# Patient Record
Sex: Female | Born: 1975 | Race: White | Hispanic: No | Marital: Single | Smoking: Never smoker
Health system: Southern US, Community
[De-identification: ages and names within clinical notes are randomized; demographics above are authoritative.]

## PROBLEM LIST (undated history)

## (undated) ENCOUNTER — Inpatient Hospital Stay (HOSPITAL_COMMUNITY): Payer: Self-pay

## (undated) DIAGNOSIS — Z789 Other specified health status: Secondary | ICD-10-CM

---

## 2001-03-21 ENCOUNTER — Other Ambulatory Visit: Admission: RE | Admit: 2001-03-21 | Discharge: 2001-03-21 | Payer: Self-pay | Admitting: Obstetrics and Gynecology

## 2001-08-16 ENCOUNTER — Encounter (HOSPITAL_COMMUNITY): Admission: RE | Admit: 2001-08-16 | Discharge: 2001-09-15 | Payer: Self-pay | Admitting: *Deleted

## 2001-08-16 ENCOUNTER — Encounter: Payer: Self-pay | Admitting: *Deleted

## 2001-09-20 ENCOUNTER — Encounter (HOSPITAL_COMMUNITY): Admission: RE | Admit: 2001-09-20 | Discharge: 2001-10-20 | Payer: Self-pay | Admitting: *Deleted

## 2003-06-10 ENCOUNTER — Inpatient Hospital Stay (HOSPITAL_COMMUNITY): Admission: AD | Admit: 2003-06-10 | Discharge: 2003-06-10 | Payer: Self-pay | Admitting: *Deleted

## 2003-06-11 ENCOUNTER — Encounter: Payer: Self-pay | Admitting: *Deleted

## 2003-07-03 ENCOUNTER — Inpatient Hospital Stay (HOSPITAL_COMMUNITY): Admission: AD | Admit: 2003-07-03 | Discharge: 2003-07-03 | Payer: Self-pay | Admitting: *Deleted

## 2007-04-08 ENCOUNTER — Ambulatory Visit (HOSPITAL_COMMUNITY): Admission: RE | Admit: 2007-04-08 | Discharge: 2007-04-08 | Payer: Self-pay | Admitting: Obstetrics & Gynecology

## 2008-12-31 ENCOUNTER — Inpatient Hospital Stay (HOSPITAL_COMMUNITY): Admission: AD | Admit: 2008-12-31 | Discharge: 2008-12-31 | Payer: Self-pay | Admitting: Obstetrics & Gynecology

## 2011-04-06 LAB — URINALYSIS, ROUTINE W REFLEX MICROSCOPIC
Bilirubin Urine: NEGATIVE
Glucose, UA: NEGATIVE mg/dL
Hgb urine dipstick: NEGATIVE
Ketones, ur: NEGATIVE mg/dL
Nitrite: NEGATIVE
Protein, ur: NEGATIVE mg/dL
Specific Gravity, Urine: 1.015 (ref 1.005–1.030)
Urobilinogen, UA: 0.2 mg/dL (ref 0.0–1.0)
pH: 6 (ref 5.0–8.0)

## 2011-04-06 LAB — RAPID URINE DRUG SCREEN, HOSP PERFORMED
Amphetamines: NOT DETECTED
Barbiturates: NOT DETECTED
Benzodiazepines: NOT DETECTED
Cocaine: POSITIVE — AB
Opiates: NOT DETECTED
Tetrahydrocannabinol: NOT DETECTED

## 2011-06-27 ENCOUNTER — Emergency Department (HOSPITAL_COMMUNITY)
Admission: EM | Admit: 2011-06-27 | Discharge: 2011-06-27 | Disposition: A | Discharge status: Home / Self Care | Payer: Self-pay | Attending: Emergency Medicine | Admitting: Emergency Medicine

## 2011-06-27 DIAGNOSIS — R300 Dysuria: Secondary | ICD-10-CM | POA: Insufficient documentation

## 2011-06-27 DIAGNOSIS — N390 Urinary tract infection, site not specified: Secondary | ICD-10-CM | POA: Insufficient documentation

## 2011-06-27 DIAGNOSIS — R109 Unspecified abdominal pain: Secondary | ICD-10-CM | POA: Insufficient documentation

## 2011-06-27 LAB — URINALYSIS, ROUTINE W REFLEX MICROSCOPIC
Bilirubin Urine: NEGATIVE
Glucose, UA: NEGATIVE mg/dL
Ketones, ur: 15 mg/dL — AB
Nitrite: NEGATIVE
Specific Gravity, Urine: 1.022 (ref 1.005–1.030)
pH: 6 (ref 5.0–8.0)

## 2011-06-27 LAB — URINE MICROSCOPIC-ADD ON

## 2011-06-28 LAB — URINE CULTURE

## 2012-03-10 ENCOUNTER — Inpatient Hospital Stay (HOSPITAL_COMMUNITY)

## 2012-03-10 ENCOUNTER — Inpatient Hospital Stay (HOSPITAL_COMMUNITY)
Admission: AD | Admit: 2012-03-10 | Discharge: 2012-03-10 | Source: Ambulatory Visit | Attending: Obstetrics and Gynecology | Admitting: Obstetrics and Gynecology

## 2012-03-10 ENCOUNTER — Encounter (HOSPITAL_COMMUNITY): Payer: Self-pay | Admitting: *Deleted

## 2012-03-10 DIAGNOSIS — N390 Urinary tract infection, site not specified: Secondary | ICD-10-CM

## 2012-03-10 DIAGNOSIS — O2390 Unspecified genitourinary tract infection in pregnancy, unspecified trimester: Secondary | ICD-10-CM | POA: Insufficient documentation

## 2012-03-10 DIAGNOSIS — O4690 Antepartum hemorrhage, unspecified, unspecified trimester: Secondary | ICD-10-CM

## 2012-03-10 DIAGNOSIS — O209 Hemorrhage in early pregnancy, unspecified: Secondary | ICD-10-CM | POA: Insufficient documentation

## 2012-03-10 HISTORY — DX: Other specified health status: Z78.9

## 2012-03-10 LAB — URINALYSIS, ROUTINE W REFLEX MICROSCOPIC
Bilirubin Urine: NEGATIVE
Glucose, UA: NEGATIVE mg/dL
Ketones, ur: NEGATIVE mg/dL
Protein, ur: NEGATIVE mg/dL
pH: 6 (ref 5.0–8.0)

## 2012-03-10 LAB — URINE MICROSCOPIC-ADD ON

## 2012-03-10 LAB — CBC
HCT: 36.8 % (ref 36.0–46.0)
Hemoglobin: 11.3 g/dL — ABNORMAL LOW (ref 12.0–15.0)
MCH: 25.5 pg — ABNORMAL LOW (ref 26.0–34.0)
MCHC: 30.7 g/dL (ref 30.0–36.0)
RDW: 16 % — ABNORMAL HIGH (ref 11.5–15.5)

## 2012-03-10 LAB — WET PREP, GENITAL
Clue Cells Wet Prep HPF POC: NONE SEEN
Trich, Wet Prep: NONE SEEN

## 2012-03-10 MED ORDER — CEPHALEXIN 500 MG PO CAPS: 500.0000 mg | ORAL_CAPSULE | Freq: Four times a day (QID) | ORAL | Status: AC

## 2012-03-10 NOTE — MAU Note (Signed)
Started bleeding a wk ago, started as red, now brownish. Only seen when wipes.  No real pain.  Hx of irregular cycles, LMP was Dec or Jan, first + HPT was March 9.

## 2012-03-10 NOTE — MAU Provider Note (Signed)
History   Pt presents today c/o vag bleeding in preg. She states she has been spotting every day for the past 1 wk. She denies abd pain, dysuria, fever,or any other sx. She is currently incarcerated and states her last episode of intercourse was about 2wks ago.  CSN: [id number]  Arrival date and time: 03/10/12 4098   First Provider Initiated Contact with Patient 03/10/12 [phone]      Chief Complaint  Patient presents with  . Vaginal Bleeding   HPI  OB History    Grav Para Term Preterm Abortions TAB SAB Ect Mult Living   8 5 5  0 1 0 1 0 0 5      Past Medical History  Diagnosis Date  . No pertinent past medical history     Past Surgical History  Procedure Date  . Cesarean section     Family History  Problem Relation Age of Onset  . Anesthesia problems Neg Hx     History  Substance Use Topics  . Smoking status: Never Smoker   . Smokeless tobacco: Not on file  . Alcohol Use: Yes    Allergies: No Known Allergies  Prescriptions prior to admission  Medication Sig Dispense Refill  . Prenatal Vit-Fe Fumarate-FA (PRENATAL MULTIVITAMIN) TABS Take 1 tablet by mouth at bedtime.        Review of Systems  Constitutional: Negative for fever and chills.  Eyes: Negative for blurred vision and double vision.  Respiratory: Negative for cough, hemoptysis, sputum production, shortness of breath and wheezing.   Cardiovascular: Negative for chest pain and palpitations.  Gastrointestinal: Negative for nausea, vomiting, abdominal pain, diarrhea and constipation.  Genitourinary: Negative for dysuria, urgency, frequency and hematuria.  Neurological: Negative for dizziness and headaches.  Psychiatric/Behavioral: Negative for depression and suicidal ideas.   Physical Exam   Blood pressure 101/63, pulse 72, temperature 97.3 F (36.3 C), temperature source Oral, resp. rate 20, height 5\' 1"  (1.549 m), weight 136 lb (61.689 kg), last menstrual period 12/20/2011.  Physical Exam  Nursing  note and vitals reviewed. Constitutional: She is oriented to person, place, and time. She appears well-developed and well-nourished. No distress.  HENT:  Head: Normocephalic and atraumatic.  Eyes: EOM are normal. Pupils are equal, round, and reactive to light.  GI: Soft. She exhibits no distension and no mass. There is no tenderness. There is no rebound and no guarding.  Genitourinary: There is bleeding around the vagina. Vaginal discharge found.       Moderate amount of vag bleeding noted on exam. Cervix Lg/closed. Uterus 8wks size and non-tender. No adnexal masses. Pt non-tender on exam.  Neurological: She is alert and oriented to person, place, and time.  Skin: Skin is warm and dry. She is not diaphoretic.  Psychiatric: She has a normal mood and affect. Her behavior is normal. Judgment and thought content normal.    MAU Course  Procedures  Wet prep and GC/Chlamydia done.  Results for orders placed during the hospital encounter of 03/10/12 (from the past 72 hour(s))  WET PREP, GENITAL     Status: Abnormal   Collection Time   03/10/12  9:30 AM      Component Value Range Comment   Yeast Wet Prep HPF POC NONE SEEN  NONE SEEN     Trich, Wet Prep NONE SEEN  NONE SEEN     Clue Cells Wet Prep HPF POC NONE SEEN  NONE SEEN     WBC, Wet Prep HPF POC FEW (*) NONE SEEN  MANY BACTERIA SEEN  CBC     Status: Abnormal   Collection Time   03/10/12  9:32 AM      Component Value Range Comment   WBC 8.2  4.0 - 10.5 (K/uL)    RBC 4.43  3.87 - 5.11 (MIL/uL)    Hemoglobin 11.3 (*) 12.0 - 15.0 (g/dL)    HCT 16.1  09.6 - 04.5 (%)    MCV 83.1  78.0 - 100.0 (fL)    MCH 25.5 (*) 26.0 - 34.0 (pg)    MCHC 30.7  30.0 - 36.0 (g/dL)    RDW 40.9 (*) 81.1 - 15.5 (%)    Platelets 279  150 - 400 (K/uL)   HCG, QUANTITATIVE, PREGNANCY     Status: Abnormal   Collection Time   03/10/12  9:32 AM      Component Value Range Comment   hCG, Beta Chain, Quant, S 3705 (*) <5 (mIU/mL)   ABO/RH     Status: Normal  (Preliminary result)   Collection Time   03/10/12  9:32 AM      Component Value Range Comment   ABO/RH(D) O POS     URINALYSIS, ROUTINE W REFLEX MICROSCOPIC     Status: Abnormal   Collection Time   03/10/12  9:40 AM      Component Value Range Comment   Color, Urine YELLOW  YELLOW     APPearance HAZY (*) CLEAR     Specific Gravity, Urine 1.010  1.005 - 1.030     pH 6.0  5.0 - 8.0     Glucose, UA NEGATIVE  NEGATIVE (mg/dL)    Hgb urine dipstick LARGE (*) NEGATIVE     Bilirubin Urine NEGATIVE  NEGATIVE     Ketones, ur NEGATIVE  NEGATIVE (mg/dL)    Protein, ur NEGATIVE  NEGATIVE (mg/dL)    Urobilinogen, UA 0.2  0.0 - 1.0 (mg/dL)    Nitrite NEGATIVE  NEGATIVE     Leukocytes, UA TRACE (*) NEGATIVE    URINE MICROSCOPIC-ADD ON     Status: Abnormal   Collection Time   03/10/12  9:40 AM      Component Value Range Comment   Squamous Epithelial / LPF FEW (*) RARE     WBC, UA 3-6  <3 (WBC/hpf)    RBC / HPF 7-10  <3 (RBC/hpf)    Bacteria, UA MANY (*) RARE    POCT PREGNANCY, URINE     Status: Abnormal   Collection Time   03/10/12  9:48 AM      Component Value Range Comment   Preg Test, Ur POSITIVE (*) NEGATIVE      Urine sent for culture.  US shows single intrauterine gestational sac with yolk sac. Small subchorionic hemorrhage is noted. Assessment and Plan  Vag bleeding in preg: discussed with pt at length. She is to begin prenatal care. Gave precautions. Discussed diet, activity, risks, and precautions.  UTI: will await urine culture. Will give Rx for Keflex. Discussed diet, activity,risks, and precautions.  Clinton Gallant. Aubreanna Percle III, DrHSc, MPAS, PA-C  03/10/2012, 10:03 AM

## 2012-03-10 NOTE — MAU Note (Signed)
Pt in c/o spotting with wiping x1 week.  States its typically brown, but occasionally red.  Denies any pain.  LMP 12/20/11.

## 2012-03-10 NOTE — Discharge Instructions (Signed)
Urinary Tract Infection Infections of the urinary tract can start in several places. A bladder infection (cystitis), a kidney infection (pyelonephritis), and a prostate infection (prostatitis) are different types of urinary tract infections (UTIs). They usually get better if treated with medicines (antibiotics) that kill germs. Take all the medicine until it is gone. You or your child may feel better in a few days, but TAKE ALL MEDICINE or the infection may not respond and may become more difficult to treat. HOME CARE INSTRUCTIONS   Drink enough water and fluids to keep the urine clear or pale yellow. Cranberry juice is especially recommended, in addition to large amounts of water.   Avoid caffeine, tea, and carbonated beverages. They tend to irritate the bladder.   Alcohol may irritate the prostate.   Only take over-the-counter or prescription medicines for pain, discomfort, or fever as directed by your caregiver.  To prevent further infections:  Empty the bladder often. Avoid holding urine for long periods of time.   After a bowel movement, women should cleanse from front to back. Use each tissue only once.   Empty the bladder before and after sexual intercourse.  FINDING OUT THE RESULTS OF YOUR TEST Not all test results are available during your visit. If your or your child's test results are not back during the visit, make an appointment with your caregiver to find out the results. Do not assume everything is normal if you have not heard from your caregiver or the medical facility. It is important for you to follow up on all test results. SEEK MEDICAL CARE IF:   There is back pain.   Your baby is older than 3 months with a rectal temperature of 100.5 F (38.1 C) or higher for more than 1 day.   Your or your child's problems (symptoms) are no better in 3 days. Return sooner if you or your child is getting worse.  SEEK IMMEDIATE MEDICAL CARE IF:   There is severe back pain or lower  abdominal pain.   You or your child develops chills.   You have a fever.   Your baby is older than 3 months with a rectal temperature of 102 F (38.9 C) or higher.   Your baby is 20 months old or younger with a rectal temperature of 100.4 F (38 C) or higher.   There is nausea or vomiting.   There is continued burning or discomfort with urination.  MAKE SURE YOU:   Understand these instructions.   Will watch your condition.   Will get help right away if you are not doing well or get worse.  Document Released: 09/16/2005 Document Revised: 11/26/2011 Document Reviewed: 04/21/2007 [facility] Patient Information 2012 [place], [place].Vaginal Bleeding During Pregnancy A small amount of bleeding from the vagina can happen anytime during pregnancy. Be sure to tell your doctor about all vaginal bleeding.  HOME CARE  Get plenty of rest and sleep.   Count the number of pads you use each day. Do not use tampons.   Save any tissue you pass for your doctor to see.   Do not exercise   Do not do any heavy lifting.   Avoid going up and down stairs. If you must climb stairs, go slowly.   Do not have sex (intercourse) or orgasms until approved by your doctor.   Do not douche.   Only take medicine as told by your doctor. Do not take aspirin.   Eat healthy.   Always keep your follow-up appointments.  GET HELP RIGHT AWAY IF:   You feel the baby moving less or not moving at all.   The bleeding gets worse.   You have very painful cramps or pain in your stomach or back.   You pass large clots or anything that looks like tissue.   You have a temperature by mouth above 102 F (38.9 C).   You feel very weak.   You have chills.   You feel dizzy or pass out (faint).   You have a gush of fluid from the vagina.  MAKE SURE YOU:   Understand these instructions.   Will watch your condition.   Will get help right away if you are not doing well or get worse.  Document Released:  09/15/2008 Document Revised: 11/26/2011 Document Reviewed: 11/12/2009 [facility] Patient Information 2012 [place], [place].

## 2012-03-11 LAB — URINE CULTURE: Culture  Setup Time: 201303211441

## 2012-03-11 LAB — GC/CHLAMYDIA PROBE AMP, GENITAL: Chlamydia, DNA Probe: POSITIVE — AB

## 2012-03-12 NOTE — MAU Provider Note (Signed)
Agree with above note.  Becky Jennings 03/12/2012 6:55 AM   

## 2012-04-18 ENCOUNTER — Inpatient Hospital Stay (HOSPITAL_COMMUNITY)

## 2012-04-18 ENCOUNTER — Inpatient Hospital Stay (HOSPITAL_COMMUNITY)
Admission: AD | Admit: 2012-04-18 | Discharge: 2012-04-18 | Disposition: A | Discharge status: Home / Self Care | Source: Ambulatory Visit | Attending: Obstetrics & Gynecology | Admitting: Obstetrics & Gynecology

## 2012-04-18 ENCOUNTER — Encounter (HOSPITAL_COMMUNITY): Payer: Self-pay

## 2012-04-18 DIAGNOSIS — O039 Complete or unspecified spontaneous abortion without complication: Secondary | ICD-10-CM

## 2012-04-18 DIAGNOSIS — O368390 Maternal care for abnormalities of the fetal heart rate or rhythm, unspecified trimester, not applicable or unspecified: Secondary | ICD-10-CM

## 2012-04-18 NOTE — MAU Note (Signed)
[facility]. They were unable to detect a fetal heart tone. Patient states she had some bleeding for 4 days last week, No pain or bleeding at this time.

## 2012-04-18 NOTE — Discharge Instructions (Signed)
Miscarriage  An early pregnancy loss or spontaneous abortion (miscarriage) is a common problem. This usually happens when the pregnancy is not developing normally. It is very unlikely that you or your partner did anything to cause this, although cigarette smoking, a sexually transmitted disease, excessive alcohol use, or drug abuse can increase the risk. Other causes are:   Abnormalities of the uterus.   Hormone or medical problems.   Trauma or genetic (chromosome) problems.  Having a miscarriage does not change your chances of having a normal pregnancy in the future. Your caregiver will advise you when it is safe to try to get pregnant again.  AFTER A MISCARRIAGE   A miscarriage is inevitable when there is continual, heavy vaginal bleeding; cramping; dilation of the cervix; or passing of any pregnancy tissue. Bleeding and cramping will usually continue until all the tissue has been removed from the womb (uterus).   Often the uterus does not clean itself out completely. A medication or a D&C procedure is needed to loosen or remove the pregnancy tissue from the uterus. A D&C scrapes or suctions the tissue out.   If you are RH negative, you may need to have Rh immune globulin to avoid Rh problems.   You may be given medication to fight an infection if the miscarriage was due to an infection.  HOME CARE INSTRUCTIONS    You should rest in bed for the next 2 to 3 days.   Do not take tub baths or put anything in your vagina, including tampons or a douche.   Do not have sex until your caregiver approves.   Avoid exercise or heavy activities until directed by your caregiver.   Save any vaginal discharge that looks like tissue. Ask your caregiver if he or she wants to inspect the discharge.   If you and your partner are having problems with guilt or grieving, talk to your caregiver or get counseling to help you understand and cope with your pregnancy loss.   Allow enough time to grieve before trying to get  pregnant again.  SEEK IMMEDIATE MEDICAL CARE IF:    You have persistent heavy bleeding or a bad smelling vaginal discharge.   You have continued abdominal or pelvic pain.   You have an oral temperature above 102 F (38.9 C), not controlled by medicine.   You have severe weakness, fainting, or keep throwing up (vomiting).   You develop chills.   You are experiencing domestic violence.  MAKE SURE YOU:    Understand these instructions.   Will watch your condition.   Will get help right away if you are not doing well or get worse.  Document Released: 01/14/2005 Document Revised: 11/26/2011 Document Reviewed: 11/29/2008  [employer] Patient Information 2012 [employer].

## 2012-04-18 NOTE — MAU Note (Signed)
Pt sent over from health department due to inability to hear fetal heart tones.  Had 4 days of moderate bleeding last week, no pain.  No bleeding now.

## 2012-04-18 NOTE — MAU Provider Note (Signed)
History     CSN: [id number]  Arrival date & time 04/18/12  1020   None     No chief complaint on file.   HPI Becky Jennings is a 36 y.o. female @ [redacted]w[redacted]d gestation who presents to MAU for vaginal bleeding and possible miscarriage. She was at the health department today for her prenatal visit and they were unable to heart fetal heart tones. She was sent to MAU for further evaluation. She reports having vaginal bleeding that was equal to a period for 3 days and then the bleeding stopped. She had no pain with the bleeding. Her last ultrasound was 03/10/12 and showed a 6.1 week IUGS with YS and a small SCH. The patient was escorted to MAU by the [facility] Department. The history was provided by the patient and her previous medical record.   Past Medical History  Diagnosis Date  . No pertinent past medical history     Past Surgical History  Procedure Date  . Cesarean section     Family History  Problem Relation Age of Onset  . Anesthesia problems Neg Hx     History  Substance Use Topics  . Smoking status: Never Smoker   . Smokeless tobacco: Not on file  . Alcohol Use: Yes    OB History    Grav Para Term Preterm Abortions TAB SAB Ect Mult Living   8 5 5  0 1 0 1 0 0 5      Review of Systems  Constitutional: Negative for fever, appetite change and fatigue.  HENT: Negative.   Eyes: Negative.   Respiratory: Negative.   Cardiovascular: Negative.   Gastrointestinal: Negative for nausea, vomiting and abdominal pain.  Genitourinary: Negative for dysuria, urgency, flank pain, vaginal bleeding (bleeding has stopped), vaginal discharge, vaginal pain and pelvic pain.  Musculoskeletal: Negative for back pain.  Neurological: Negative for dizziness and headaches.    Allergies  Review of patient's allergies indicates no known allergies.  Home Medications  No current outpatient prescriptions on file.  BP 108/78  Pulse 82  Temp(Src) 98.1 F (36.7 C) (Oral)  Resp  16  Ht 5' (1.524 m)  Wt 135 lb (61.236 kg)  BMI 26.37 kg/m2  SpO2 100%  LMP 12/20/2011   Physical Exam  Nursing note and vitals reviewed. Constitutional: She is oriented to person, place, and time. She appears well-developed and well-nourished. No distress.  HENT:  Head: Normocephalic.  Eyes: EOM are normal.  Neck: Neck supple.  Cardiovascular: Normal rate.   Pulmonary/Chest: Effort normal.  Abdominal: Soft. There is no tenderness.  Musculoskeletal: Normal range of motion.  Neurological: She is alert and oriented to person, place, and time. No cranial nerve deficit.  Skin: Skin is warm and dry.  Psychiatric: She has a normal mood and affect. Her behavior is normal. Judgment and thought content normal.   Her blood type is O positive  Assessment: Complete SAB  Plan:  Follow up with [facility] as needed.    I have reviewed this patient's vital signs, nurses notes, appropriate labs and imaging.  ED Course  Procedures   MDM

## 2012-04-27 NOTE — MAU Provider Note (Signed)
Medical Screening exam and patient care preformed by advanced practice provider.  Agree with the above management.  

## 2013-02-18 ENCOUNTER — Inpatient Hospital Stay (HOSPITAL_COMMUNITY): Payer: Medicaid Other

## 2013-02-18 ENCOUNTER — Encounter (HOSPITAL_COMMUNITY): Payer: Self-pay | Admitting: Obstetrics and Gynecology

## 2013-02-18 ENCOUNTER — Inpatient Hospital Stay (HOSPITAL_COMMUNITY)
Admission: AD | Admit: 2013-02-18 | Discharge: 2013-02-18 | Disposition: A | Discharge status: Home / Self Care | Payer: Medicaid Other | Source: Ambulatory Visit | Attending: Obstetrics & Gynecology | Admitting: Obstetrics & Gynecology

## 2013-02-18 DIAGNOSIS — O200 Threatened abortion: Secondary | ICD-10-CM

## 2013-02-18 DIAGNOSIS — O26859 Spotting complicating pregnancy, unspecified trimester: Secondary | ICD-10-CM | POA: Insufficient documentation

## 2013-02-18 DIAGNOSIS — R109 Unspecified abdominal pain: Secondary | ICD-10-CM | POA: Insufficient documentation

## 2013-02-18 LAB — URINALYSIS, ROUTINE W REFLEX MICROSCOPIC
Glucose, UA: NEGATIVE mg/dL
Hgb urine dipstick: NEGATIVE
Specific Gravity, Urine: 1.01 (ref 1.005–1.030)
Urobilinogen, UA: 0.2 mg/dL (ref 0.0–1.0)
pH: 6 (ref 5.0–8.0)

## 2013-02-18 LAB — HCG, QUANTITATIVE, PREGNANCY: hCG, Beta Chain, Quant, S: 67600 m[IU]/mL — ABNORMAL HIGH (ref <5)

## 2013-02-18 LAB — CBC
Hemoglobin: 11.5 g/dL — ABNORMAL LOW (ref 12.0–15.0)
MCHC: 33.7 g/dL (ref 30.0–36.0)
Platelets: 246 10*3/uL (ref 150–400)
RDW: 12.3 % (ref 11.5–15.5)

## 2013-02-18 LAB — WET PREP, GENITAL: Trich, Wet Prep: NONE SEEN

## 2013-02-18 NOTE — MAU Provider Note (Signed)
History     CSN: [id number]  Arrival date and time: 02/18/13 1715   None     Chief Complaint  Patient presents with  . Abdominal Cramping   HPI 37 y.o. [id number] at [redacted]w[redacted]d by LMP with cramping x 1 week, spotting last week. H/O SAB.    Past Medical History  Diagnosis Date  . No pertinent past medical history     Past Surgical History  Procedure Laterality Date  . Cesarean section      Family History  Problem Relation Age of Onset  . Anesthesia problems Neg Hx     History  Substance Use Topics  . Smoking status: Never Smoker   . Smokeless tobacco: Not on file  . Alcohol Use: Yes    Allergies: No Known Allergies  Prescriptions prior to admission  Medication Sig Dispense Refill  . Prenatal Vit-Fe Fumarate-FA (PRENATAL MULTIVITAMIN) TABS Take 1 tablet by mouth at bedtime.        Review of Systems  Constitutional: Negative.   Respiratory: Negative.   Cardiovascular: Negative.   Gastrointestinal: Positive for abdominal pain. Negative for nausea, vomiting, diarrhea and constipation.  Genitourinary: Negative for dysuria, urgency, frequency, hematuria and flank pain.       + spotting   Musculoskeletal: Negative.   Neurological: Negative.   Psychiatric/Behavioral: Negative.    Physical Exam   Blood pressure 114/80, pulse 92, temperature 98.5 F (36.9 C), temperature source Oral, resp. rate 18, height 5' (1.524 m), weight 137 lb 6.4 oz (62.324 kg), last menstrual period 12/16/2012.  Physical Exam  Nursing note and vitals reviewed. Constitutional: She is oriented to person, place, and time. She appears well-developed and well-nourished. No distress.  Cardiovascular: Normal rate.   Respiratory: Effort normal.  GI: Soft. There is no tenderness.  Genitourinary: There is no tenderness or lesion on the right labia. There is no tenderness or lesion on the left labia. Uterus is enlarged (9 week size). Uterus is not tender. Cervix exhibits no motion tenderness, no  discharge and no friability. Right adnexum displays no mass, no tenderness and no fullness. Left adnexum displays no mass, no tenderness and no fullness. No bleeding around the vagina. No vaginal discharge found.    Musculoskeletal: Normal range of motion.  Neurological: She is alert and oriented to person, place, and time.  Skin: Skin is warm and dry.  Psychiatric: She has a normal mood and affect.    MAU Course  Procedures Results for orders placed during the hospital encounter of 02/18/13 (from the past 24 hour(s))  URINALYSIS, ROUTINE W REFLEX MICROSCOPIC     Status: None   Collection Time    02/18/13  5:15 PM      Result Value Range   Color, Urine YELLOW  YELLOW   APPearance CLEAR  CLEAR   Specific Gravity, Urine 1.010  1.005 - 1.030   pH 6.0  5.0 - 8.0   Glucose, UA NEGATIVE  NEGATIVE mg/dL   Hgb urine dipstick NEGATIVE  NEGATIVE   Bilirubin Urine NEGATIVE  NEGATIVE   Ketones, ur NEGATIVE  NEGATIVE mg/dL   Protein, ur NEGATIVE  NEGATIVE mg/dL   Urobilinogen, UA 0.2  0.0 - 1.0 mg/dL   Nitrite NEGATIVE  NEGATIVE   Leukocytes, UA NEGATIVE  NEGATIVE  POCT PREGNANCY, URINE     Status: Abnormal   Collection Time    02/18/13  5:34 PM      Result Value Range   Preg Test, Ur POSITIVE (*) NEGATIVE  CBC  Status: Abnormal   Collection Time    02/18/13  5:50 PM      Result Value Range   WBC 8.0  4.0 - 10.5 K/uL   RBC 3.80 (*) 3.87 - 5.11 MIL/uL   Hemoglobin 11.5 (*) 12.0 - 15.0 g/dL   HCT 16.1 (*) 09.6 - 04.5 %   MCV 89.7  78.0 - 100.0 fL   MCH 30.3  26.0 - 34.0 pg   MCHC 33.7  30.0 - 36.0 g/dL   RDW 40.9  81.1 - 91.4 %   Platelets 246  150 - 400 K/uL  WET PREP, GENITAL     Status: Abnormal   Collection Time    02/18/13  6:20 PM      Result Value Range   Yeast Wet Prep HPF POC NONE SEEN  NONE SEEN   Trich, Wet Prep NONE SEEN  NONE SEEN   Clue Cells Wet Prep HPF POC NONE SEEN  NONE SEEN   WBC, Wet Prep HPF POC MODERATE (*) NONE SEEN   10 wk IUP, + FHR on  u/s  Assessment and Plan  . 1. Bloody show and cramping in early pregnancy   Precautions rev'd, pregnancy verification given Encouraged to start care as soon as possible, needs pap when she starts prenatal care (informed of lesion on cervix - she states "it's been a long time" since she had a pap)    Medication List    TAKE these medications       prenatal multivitamin Tabs  Take 1 tablet by mouth at bedtime.            Follow-up Information   Follow up with provider of your choice. (start prenatal care as soon as possible)         Jaasiel Hollyfield 02/18/2013, 6:01 PM

## 2013-02-18 NOTE — MAU Note (Signed)
Pt reports had positive HPT. C/O abd cramping x 1 week. Had some spotting last week none now.

## 2013-02-18 NOTE — MAU Provider Note (Signed)
Attestation of Attending Supervision of Advanced Practitioner (PA/CNM/NP): Evaluation and management procedures were performed by the Advanced Practitioner under my supervision and collaboration.  I have reviewed the Advanced Practitioner's note and chart, and I agree with the management and plan.  Masao Junker, MD, FACOG Attending Obstetrician & Gynecologist Faculty Practice, [facility]  

## 2013-02-20 LAB — GC/CHLAMYDIA PROBE AMP: CT Probe RNA: NEGATIVE

## 2013-05-31 IMAGING — US US OB TRANSVAGINAL
1 series · 13 of 28 positions shown · non-contrast
Comparison: none

[Series 1: us ob transvaginal · 13 of 41 slices shown]
[im 2/41]
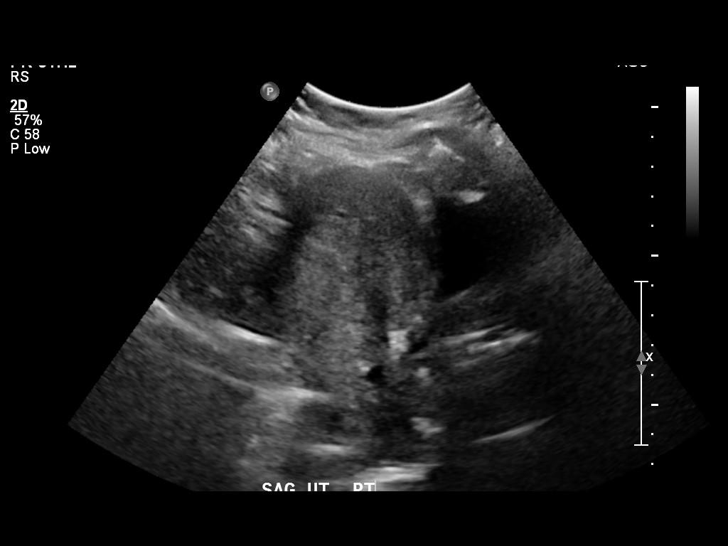
[im 5/41]
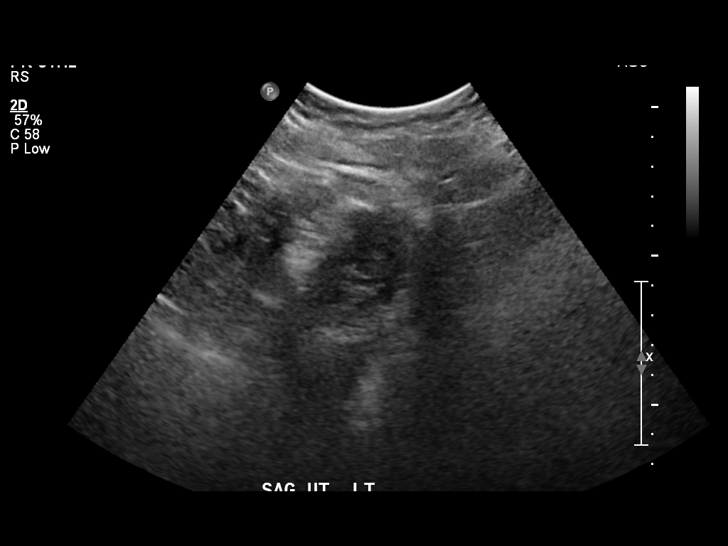
[im 8/41]
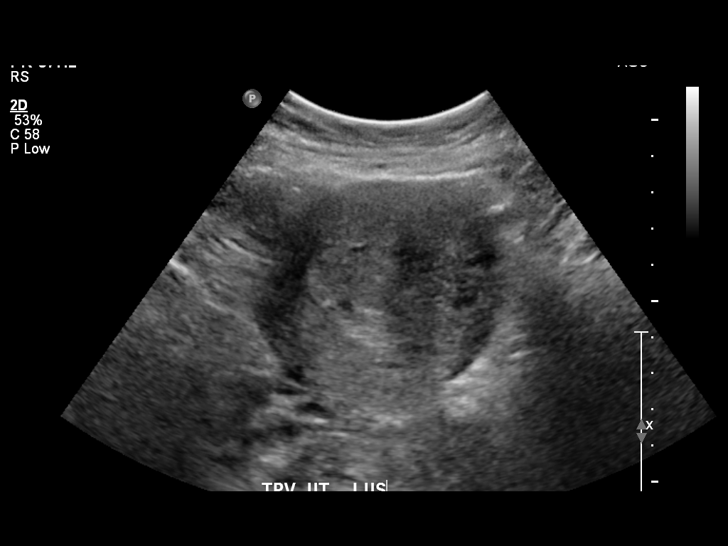
[im 11/41]
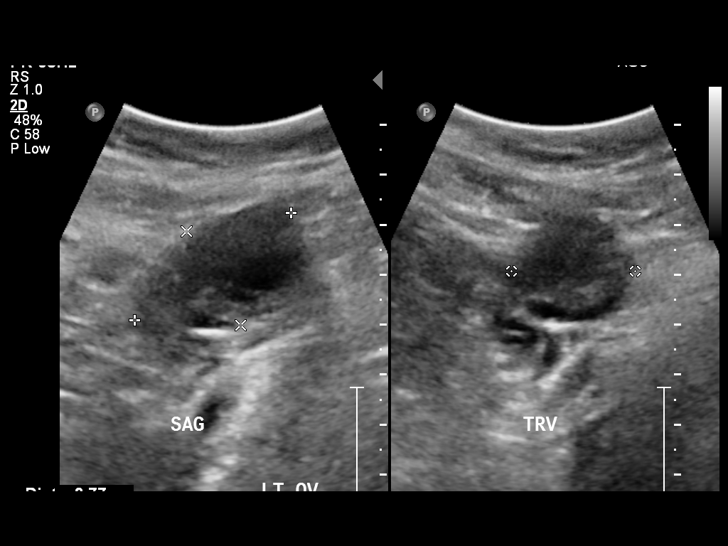
[im 14/41]
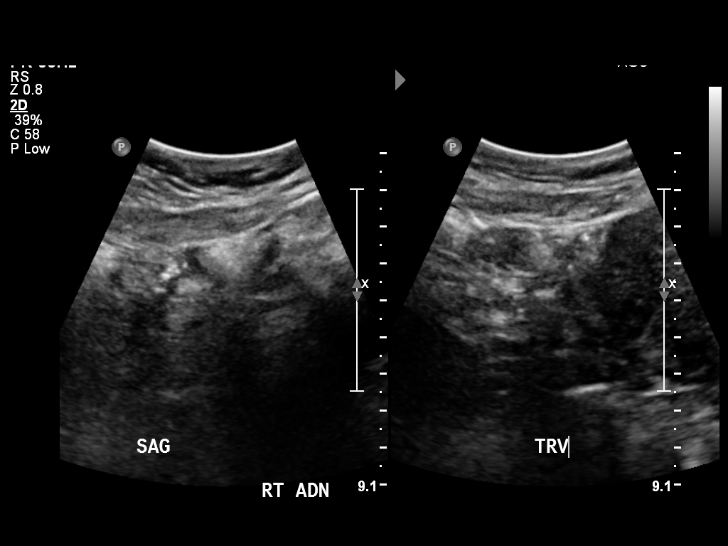
[im 17/41]
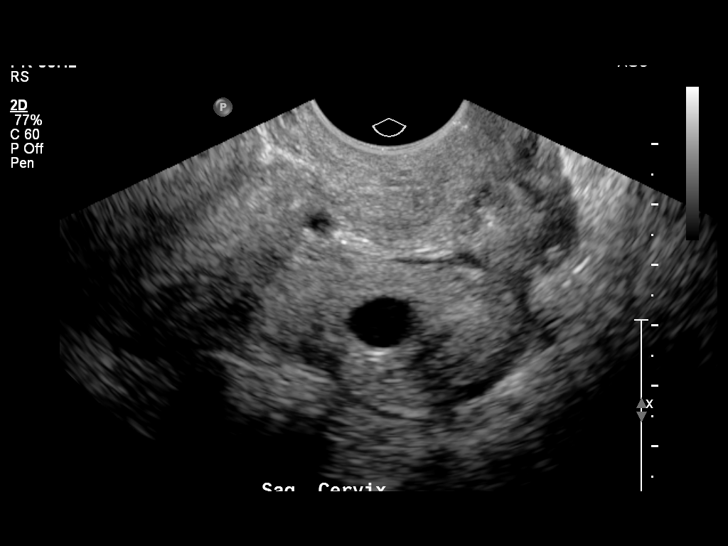
[im 21/41]
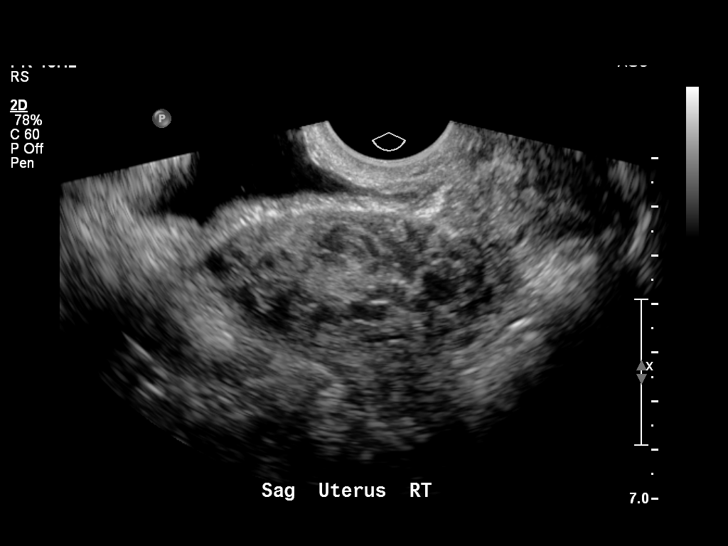
[im 24/41]
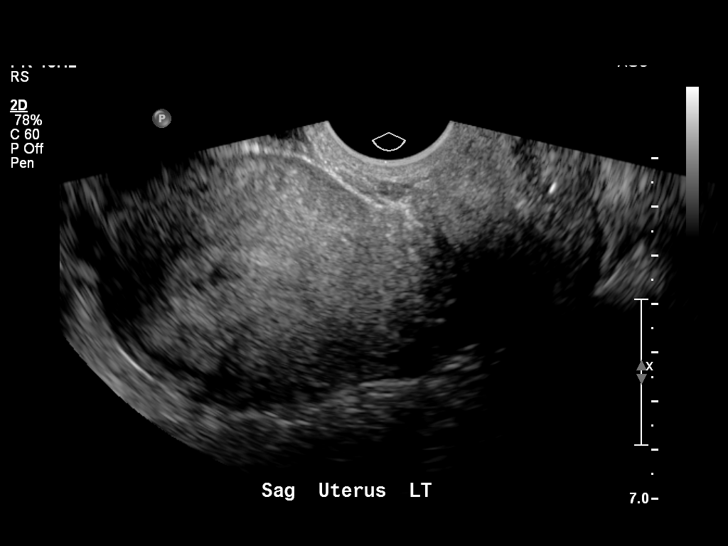
[im 27/41]
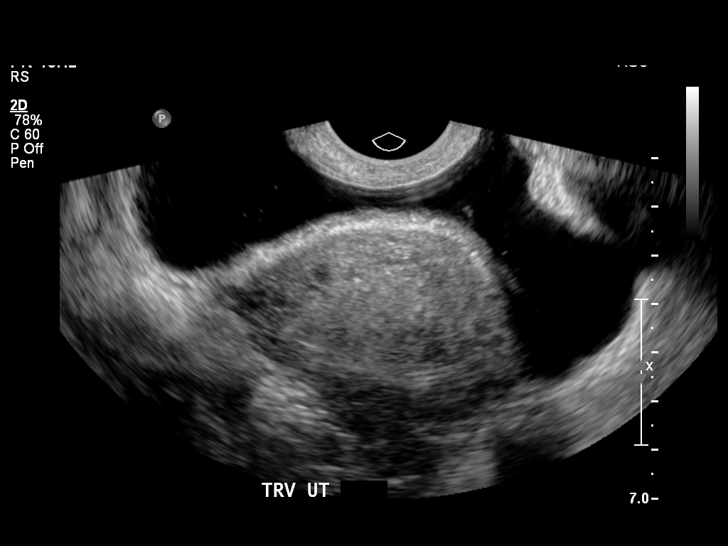
[im 30/41]
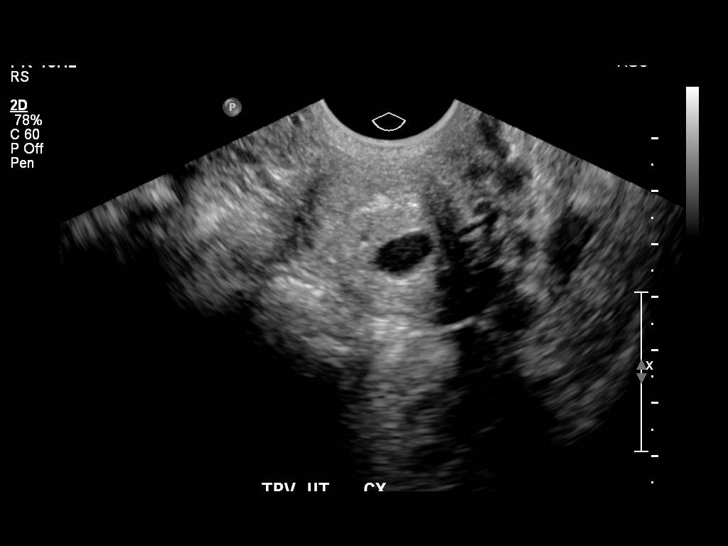
[im 33/41]
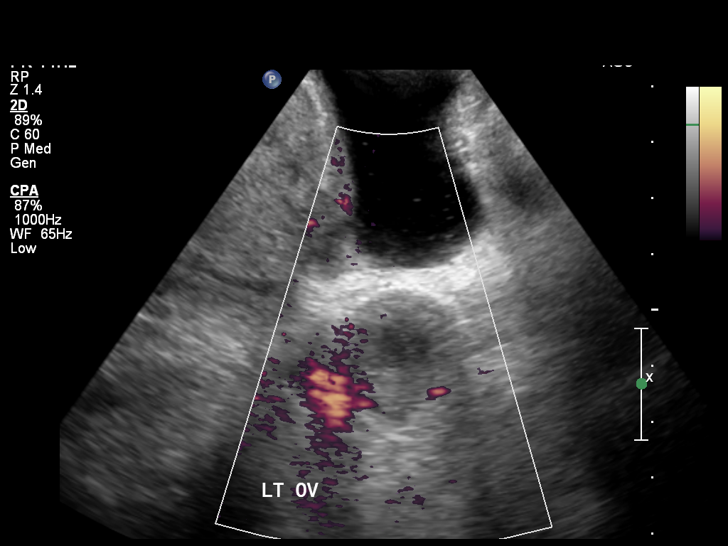
[im 36/41]
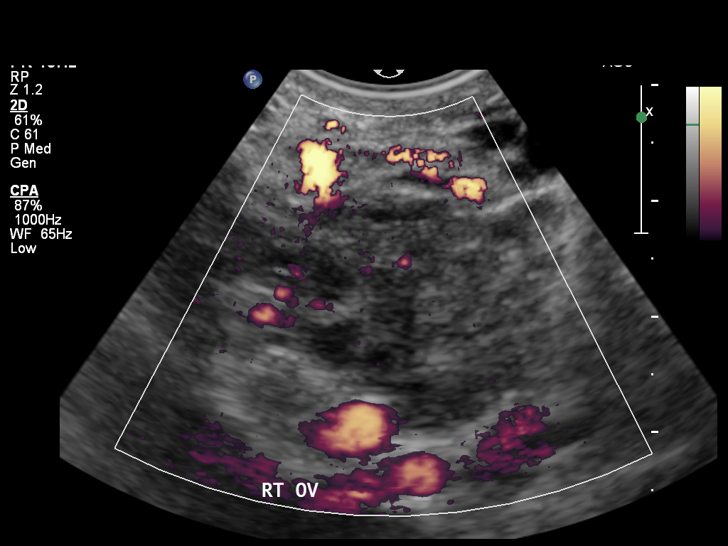
[im 39/41]
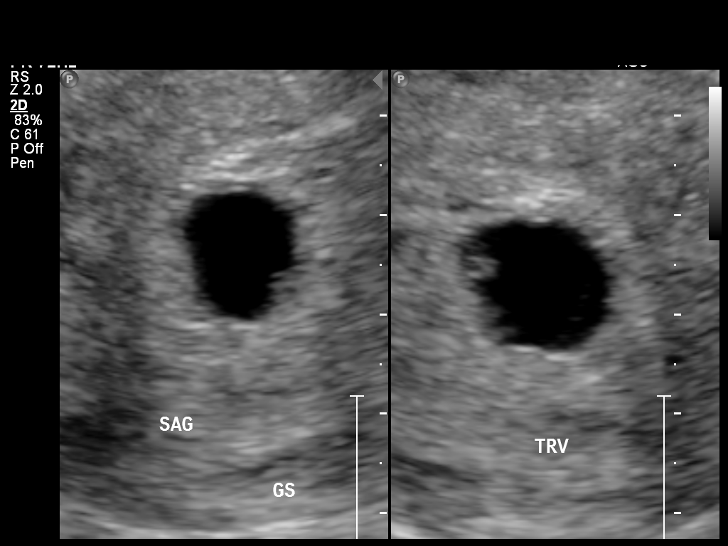

[13 of 28 positions shown; findings below may reference images not displayed]

OBSTETRICS REPORT
                      (Signed Final 03/10/2012 [DATE])

                 51_E
Procedures

 US OB TRANSVAGINAL                                    76817.0
 US OB COMP LESS 14 WKS                                76801.0
Indications

 Unsure of LMP;  Establish Gestational [AGE]
 Vaginal bleeding, unknown etiology
Fetal Evaluation

 Preg. Location:    Intrauterine
 Gest. Sac:         Intrauterine
 Yolk Sac:          Visualized
 Fetal Pole:        Not visualized
 Cardiac Activity:  No embryo visualized
Biometry

 GS:      13.4  mm    G. Age:   6w 1d                  EDD:   11/02/12
Cervix Uterus Adnexa

 Cervix:       Closed.
 Uterus:       Small subchorionic hemmorhage noted.
 Left Ovary:   Normal, measuring 3.1x 1.9 x 2.8 cm.  Small corpus
               luteum noted.
 Right Ovary:  Normal, measuring 3.6 x 2.2 x 1.5cm.
 Adnexa:     No abnormality visualized.
Impression

 Single intrauterine gestational sac with yolk sac. No fetal pole
 is seen, but is not necessarily expected at today's MSD of
 13.4 mm. With normal growth rates, we would typically
 expect to see a fetal pole develope in 1 week. Correlation
 with serial bHCG or follow up ultrasound in 1 week can be
 performed to assess for appropriate progression of gestation.

 Small subacute subchorionic hemorrhage.

 Normal ovaries.

## 2013-12-24 ENCOUNTER — Encounter (HOSPITAL_COMMUNITY): Payer: Self-pay | Admitting: *Deleted

## 2014-10-22 ENCOUNTER — Encounter (HOSPITAL_COMMUNITY): Payer: Self-pay | Admitting: *Deleted

## 2015-10-23 ENCOUNTER — Emergency Department (HOSPITAL_COMMUNITY)
Admission: EM | Admit: 2015-10-23 | Discharge: 2015-10-24 | Disposition: A | Discharge status: Home / Self Care | Payer: Self-pay | Attending: Emergency Medicine | Admitting: Emergency Medicine

## 2015-10-23 ENCOUNTER — Encounter (HOSPITAL_COMMUNITY): Payer: Self-pay | Admitting: Emergency Medicine

## 2015-10-23 ENCOUNTER — Emergency Department (HOSPITAL_COMMUNITY): Payer: Medicaid Other

## 2015-10-23 DIAGNOSIS — F1310 Sedative, hypnotic or anxiolytic abuse, uncomplicated: Secondary | ICD-10-CM | POA: Insufficient documentation

## 2015-10-23 DIAGNOSIS — T507X1A Poisoning by analeptics and opioid receptor antagonists, accidental (unintentional), initial encounter: Secondary | ICD-10-CM | POA: Insufficient documentation

## 2015-10-23 DIAGNOSIS — Z79899 Other long term (current) drug therapy: Secondary | ICD-10-CM | POA: Insufficient documentation

## 2015-10-23 DIAGNOSIS — Y998 Other external cause status: Secondary | ICD-10-CM | POA: Insufficient documentation

## 2015-10-23 DIAGNOSIS — F1110 Opioid abuse, uncomplicated: Secondary | ICD-10-CM | POA: Insufficient documentation

## 2015-10-23 DIAGNOSIS — Y9389 Activity, other specified: Secondary | ICD-10-CM | POA: Insufficient documentation

## 2015-10-23 DIAGNOSIS — R062 Wheezing: Secondary | ICD-10-CM | POA: Insufficient documentation

## 2015-10-23 DIAGNOSIS — R079 Chest pain, unspecified: Secondary | ICD-10-CM | POA: Insufficient documentation

## 2015-10-23 DIAGNOSIS — F1410 Cocaine abuse, uncomplicated: Secondary | ICD-10-CM | POA: Insufficient documentation

## 2015-10-23 DIAGNOSIS — T50901A Poisoning by unspecified drugs, medicaments and biological substances, accidental (unintentional), initial encounter: Secondary | ICD-10-CM

## 2015-10-23 DIAGNOSIS — Y9259 Other trade areas as the place of occurrence of the external cause: Secondary | ICD-10-CM | POA: Insufficient documentation

## 2015-10-23 LAB — CBC
HCT: 39 % (ref 36.0–46.0)
Hemoglobin: 12.5 g/dL (ref 12.0–15.0)
MCH: 27.3 pg (ref 26.0–34.0)
MCHC: 32.1 g/dL (ref 30.0–36.0)
MCV: 85.2 fL (ref 78.0–100.0)
Platelets: 318 10*3/uL (ref 150–400)
RBC: 4.58 MIL/uL (ref 3.87–5.11)
RDW: 16.4 % — ABNORMAL HIGH (ref 11.5–15.5)
WBC: 13 10*3/uL — ABNORMAL HIGH (ref 4.0–10.5)

## 2015-10-23 LAB — COMPREHENSIVE METABOLIC PANEL
ALT: 91 U/L — ABNORMAL HIGH (ref 14–54)
AST: 79 U/L — ABNORMAL HIGH (ref 15–41)
Albumin: 4.7 g/dL (ref 3.5–5.0)
Alkaline Phosphatase: 79 U/L (ref 38–126)
Anion gap: 13 (ref 5–15)
BUN: 12 mg/dL (ref 6–20)
CO2: 22 mmol/L (ref 22–32)
Calcium: 8.8 mg/dL — ABNORMAL LOW (ref 8.9–10.3)
Chloride: 103 mmol/L (ref 101–111)
Creatinine, Ser: 0.61 mg/dL (ref 0.44–1.00)
GFR calc Af Amer: >60 mL/min (ref >60)
GFR calc non Af Amer: >60 mL/min (ref >60)
Glucose, Bld: 82 mg/dL (ref 65–99)
Potassium: 3.2 mmol/L — ABNORMAL LOW (ref 3.5–5.1)
Sodium: 138 mmol/L (ref 135–145)
Total Bilirubin: 0.6 mg/dL (ref 0.3–1.2)
Total Protein: 8.3 g/dL — ABNORMAL HIGH (ref 6.5–8.1)

## 2015-10-23 LAB — I-STAT BETA HCG BLOOD, ED (MC, WL, AP ONLY): I-stat hCG, quantitative: <5 m[IU]/mL (ref <5)

## 2015-10-23 LAB — ETHANOL: Alcohol, Ethyl (B): 28 mg/dL — ABNORMAL HIGH (ref <5)

## 2015-10-23 LAB — RAPID URINE DRUG SCREEN, HOSP PERFORMED
Amphetamines: NOT DETECTED
Barbiturates: NOT DETECTED
Benzodiazepines: POSITIVE — AB
Cocaine: POSITIVE — AB
Opiates: POSITIVE — AB
Tetrahydrocannabinol: NOT DETECTED

## 2015-10-23 LAB — ACETAMINOPHEN LEVEL: Acetaminophen (Tylenol), Serum: <10 ug/mL — ABNORMAL LOW (ref 10–30)

## 2015-10-23 LAB — SALICYLATE LEVEL: Salicylate Lvl: <4 mg/dL (ref 2.8–30.0)

## 2015-10-23 LAB — CBG MONITORING, ED: Glucose-Capillary: 85 mg/dL (ref 65–99)

## 2015-10-23 MED ORDER — ONDANSETRON HCL 4 MG/2ML IJ SOLN
4.0000 mg | Freq: Once | INTRAMUSCULAR | Status: AC
  Administered 2015-10-23: 4 mg via INTRAVENOUS
  Filled 2015-10-23: qty 2

## 2015-10-23 MED ORDER — SODIUM CHLORIDE 0.9 % IV BOLUS (SEPSIS)
1000.0000 mL | Freq: Once | INTRAVENOUS | Status: AC
  Administered 2015-10-23: 1000 mL via INTRAVENOUS

## 2015-10-23 NOTE — ED Notes (Signed)
Bed: RESA Expected date:  Expected time:  Means of arrival:  Comments: Ems- heroin overdose

## 2015-10-23 NOTE — ED Notes (Signed)
Per EMS[facility]-patient was found in respiratory arrest-given 2 mg of Narcan. Post narcan respirations improved and consciousness gained.

## 2015-10-23 NOTE — Discharge Instructions (Signed)
Drug Overdose °Drug overdose happens when you take too much of a drug. An overdose can occur with illegal drugs, prescription drugs, or over-the-counter (OTC) drugs. °The effects of drug overdose can be mild, dangerous, or even deadly. °CAUSES °Drug overdose may be caused by: °· Taking too much of a drug on purpose. °· Taking too much of a drug by accident. °· An error made by a health care provider who prescribes a drug. °· An error made by a pharmacist who fills the prescription order. °Drugs that commonly cause overdose include: °· Mental health drugs. °· Pain medicines. °· Illegal drugs. °· OTC cough and cold medicines. °· Heart medicines. °· Seizure medicines. °RISK FACTORS °Drug overdose is more likely in: °· Children. They may be attracted to colorful pills. Because of children's small size, even a small amount of a drug can be dangerous. °· Elderly people. They may be taking many different drugs. Elderly people may have difficulty reading labels or remembering when they last took their medicine. °The risk of drug overdose is also higher for someone who: °· Takes illegal drugs. °· Takes a drug and drinks alcohol. °· Has a mental health condition. °SYMPTOMS °Signs and symptoms of drug overdose depend on the drug and the amount that was taken. Common danger signs include: °· Behavior changes. °· Sleepiness. °· Slowed breathing. °· Nausea and vomiting. °· Seizures. °· Changes in eye pupil size (very large or very small). °If there are signs of very low blood pressure from a drug overdose (shock), emergency treatment is required. These signs include: °· Cold and clammy skin. °· Pale skin. °· Blue lips. °· Very slow breathing. °· Extreme sleepiness. °· Loss of consciousness. °DIAGNOSIS °Drug overdose may be diagnosed based on your symptoms. It is important that you tell your health care provider: °· All of the drugs that you have taken. °· When you took the drugs. °· Whether you were drinking alcohol. °Your health  care provider will do a physical exam. This exam may include: °· Checking and monitoring your heart rate and rhythm, your temperature, and your blood pressure (vital signs). °· Checking your breathing and oxygen level. °You may also have tests, including:  °· Urine tests to check for drugs in your system. °· Blood tests to check for: °¨ Drugs in your system. °¨ Signs of an imbalance of your blood minerals (electrolytes). °¨ Liver damage. °¨ Kidney damage. °TREATMENT °Supporting your vital signs and your breathing is the first step in treating a drug overdose. Treatment may also include: °· Receiving fluids and electrolytes through an IV tube. °· Having a breathing tube (endotracheal tube) inserted in your airway to help you breathe. °· Having a tube passed through your nose and into your stomach (nasogastric tube) to wash out your stomach. °· Medicines. You may get medicines to: °¨ Make you vomit. °¨ Absorb any medicine that is left in your digestive system (activated charcoal). °¨ Block or reverse the effect of the drug that caused the overdose. °· Having your blood filtered through an artificial kidney machine (hemodialysis). You may need this if your overdose is severe or if you have kidney failure. °· Having ongoing counseling and mental health support if you intentionally overdosed or used an illegal drug. °HOME CARE INSTRUCTIONS °· Take medicines only as directed by your health care provider. Always ask your health care provider to discuss the possible side effects of any new drug that you start taking. °· Keep a list of all of the drugs   that you take, including over-the-counter medicines. Bring this list with you to all of your medical visits. °· Read the drug inserts that come with your medicines. °· Do not use illegal drugs. °· Do not drink alcohol when taking drugs. °· Store all medicines in safety containers that are out of the reach of children. °· Keep the phone number of your local poison control  center near your phone or on your cell phone. °· Get help if you are struggling with alcohol or drug use. °· Get help if you are struggling with depression or another mental health problem. °· Keep all follow-up visits as directed by your health care provider. This is important. °SEEK MEDICAL CARE IF: °· Your symptoms return. °· You develop any new signs or symptoms when you are taking medicines. °SEEK IMMEDIATE MEDICAL CARE IF: °· You think that you or someone else may have taken too much of a drug. The hotline of the National Poison Control Center is (800) 222-1222. °· You or someone else is having symptoms of a drug overdose. °· You have serious thoughts about hurting yourself or others. °· You have chest pain. °· You have difficulty breathing. °· You have a loss of consciousness. °Drug overdose is an emergency. Do not wait to see if the symptoms will go away. Get medical help right away. Call your local emergency services (911 in the [place] Do not drive yourself to the hospital. °  °This information is not intended to replace advice given to you by your health care provider. Make sure you discuss any questions you have with your health care provider. °  °Document Released: 04/23/2015 Document Reviewed: 04/23/2015 °Elsevier Interactive Patient Education ©2016 Elsevier Inc. ° ° °Emergency Department Resource Guide °1) Find a Doctor and Pay Out of Pocket °Although you won't have to find out who is covered by your insurance plan, it is a good idea to ask around and get recommendations. You will then need to call the office and see if the doctor you have chosen will accept you as a new patient and what types of options they offer for patients who are self-pay. Some doctors offer discounts or will set up payment plans for their patients who do not have insurance, but you will need to ask so you aren't surprised when you get to your appointment. ° °2) Contact Your Local Health Department °Not all health departments  have doctors that can see patients for sick visits, but many do, so it is worth a call to see if yours does. If you don't know where your local health department is, you can check in your phone book. The CDC also has a tool to help you locate your state's health department, and many state websites also have listings of all of their local health departments. ° °3) Find a Walk-in Clinic °If your illness is not likely to be very severe or complicated, you may want to try a walk in clinic. These are popping up all over the country in pharmacies, drugstores, and shopping centers. They're usually staffed by nurse practitioners or physician assistants that have been trained to treat common illnesses and complaints. They're usually fairly quick and inexpensive. However, if you have serious medical issues or chronic medical problems, these are probably not your best option. ° °No Primary Care Doctor: °- Call [facility] at  832-8000 - they can help you locate a primary care doctor that  accepts your insurance, provides certain services, etc. °- Physician Referral Service-   1-800-533-3463 ° °Chronic Pain Problems: °Organization         Address  Phone   Notes  °[facility]  (336) 297-2271 Patients need to be referred by their primary care doctor.  ° °Medication Assistance: °Organization         Address  Phone   Notes  °[place] [facility] [address] °[place], [place] [address] (336) 641-8030 --Must be a resident of [place] °-- Must have NO insurance coverage whatsoever (no Medicaid/ Medicare, etc.) °-- The pt. MUST have a primary care doctor that directs their care regularly and follows them in the community °  °[facility]  (866) 331-1348   °[employer]  (888) 892-1162   ° °Agencies that provide inexpensive medical care: °Organization         Address  Phone   Notes  °[facility]  (336) 832-8035   °[facility]    (336) 832-7272    °[facility] [address] °[place], [place] [address] (336) 832-4777   °[facility] [address], °[place] (336) 271-4999   °[facility]    (336) 373-0678   °[facility]    (336) 272-1050   °[facility] ° [address], [place] Phone:  (336) 832-4444, Fax:  (336) 832-4440 Hours of Operation:  9 am - 6 pm, M-F.  Also accepts Medicaid/Medicare and self-pay.  °[facility] ° [address], [place] Phone: (336) 832-3150, Fax: (336) 832-3151. Hours of Operation:  8:30 am - 5:30 pm, M-F.  Also accepts Medicaid and self-pay.  °[facility] [place] [address], [place] Phone: (336) 878-6027   °[facility] [address], [place], [place] (336)723-1848, Ext. 123 Mondays & Thursdays: 7-9 AM.  First 15 patients are seen on a first come, first serve basis. °  ° °Medicaid-accepting [place] Providers: ° °Organization         Address  Phone   Notes  °[facility] [address], [place] (336) 641-2100 Also accepts self-pay patients.  °[facility] [address], [place] ° (336) 856-9996   °[facility] [address], [place] (336) 288-8857   °[facility] [address] [place] [address], [place] (336) 299-7000   °Veita Bland [address], [place]  ° (336) 373-1557 Only accepts [place] Access Medicaid patients after they have their name applied to their card.  ° °Self-Pay (no insurance) in [place]: ° °Organization         Address  Phone   Notes  °Sickle Cell Patients, [facility] [address], [place] (336) 832-1970   °[facility] [address], [place] (336) 832-4400   ° [facility] ° [address], [place] (336) 992-4800   °[facility]/Dr. Osei-Bonsu ° [address] [place] [address], [place] or [address], [place] (336) 841-8500 Phone number for both [place] and [place] locations is the same.  °[facility] [address], [place] (336) 299-0000   °[facility] [address] [place] [address], [place] or [address] (336) 852-7530 °(336) 878-2260   °[facility] [address],  (336) 350-1642, phone; (336) 294-5005,   fax Sees patients 1st and 3rd Saturday of every month.  Must not qualify for public or private insurance (i.e. Medicaid, Medicare, [place] Health Choice, Veterans' Benefits) • Household income should be no more than 200% of the poverty level •The clinic cannot treat you if you are pregnant or think you are pregnant • Sexually transmitted diseases are not treated at the clinic.  ° ° °Dental Care: °Organization         Address  Phone  Notes  °[facility] [address], [place] (336) 641-6152 Accepts children up to age 21 who are enrolled in Medicaid or [place] Health Choice; pregnant women with a Medicaid card; and children who have applied for Medicaid or [place] Health Choice, but were declined, whose parents can pay a reduced fee at time of service.  °[facility] [place]  [address], [place] (336) 641-7733 Accepts children up to age 21 who are enrolled in Medicaid or [place] Health Choice; pregnant women with a Medicaid card; and children who have applied for Medicaid or [place] Health Choice, but were declined, whose parents can pay a reduced fee at time of service.  °[facility] ° [address], [place] (336) 641-4533 Patients are seen by appointment only. Walk-ins are not accepted. [facility] will see patients 18 years of age and older. °Monday - Tuesday (8am-5pm) °Most Wednesdays (8:30-5pm) °$30 per visit, cash only  °[facility] ° [address], [place] (336) 641-4533 Patients are seen by appointment only. Walk-ins are not accepted. [facility] will see patients 18 years of age and older. °One Wednesday Evening (Monthly: Volunteer Based).  $30 per visit, cash only  °[facility]  (919) 537-3737 for adults; Children under age 4, call Graduate Pediatric Dentistry at (919) [phone]. Children aged 4-14, please call (919) [phone] to request a pediatric application. ° Dental services are provided in all areas of dental care including fillings, crowns and bridges, complete and partial dentures, implants, gum treatment, root canals, and extractions. Preventive care is also provided. Treatment is provided to both adults and children. °Patients are selected via a lottery and there is often a waiting list. °  °[facility] [address], °[place] ° (336) 763-8833 [email or web address] °  °[facility] [address], [place], [place] (336)723-1848, Ext. 123 Second and Fourth Thursday of each month, opens at 6:30 AM; Clinic ends at 9 AM.  Patients are seen on a first-come first-served basis, and a limited number are seen during each clinic.  ° °[facility] ° [address], [place], [place] (336) 723-7904   Eligibility Requirements °You must have lived in [place], [place], or [place] counties for at least the last three months. °  You cannot be eligible for state or federal sponsored healthcare insurance, including Veterans Administration, Medicaid, or Medicare. °  You generally cannot be eligible for healthcare insurance through your employer.  °  How to apply: °Eligibility screenings are held every Tuesday and Wednesday afternoon from 1:00 pm until 4:00 pm. You do not need an appointment for the interview!  °[facility] [address], [place], [place] 336-631-2330   °[facility]  336-342-8273   °[place]  336-703-3100   °[facility]  336-570-6415   ° °Behavioral Health Resources in the Community: °Intensive   Outpatient Programs °Organization         Address  Phone  Notes  °[facility] [address], [place], [place] 336-878-6098   °[facility] [address], [place], [place] 336-832-9800   °[facility] [address], [place], [place] ° 336-882-2125   °[facility] [address],  °[place], [place] 1-800-853-5163 or 336-641-4981   °Substance Abuse Resources °Organization         Address  Phone  Notes  °[facility]  336-882-2125   °[facility]  336-784-9470   °[facility]  336-285-9073   °[facility]  336-845-3988   °Residential & Outpatient Substance Abuse Program  1-800-659-3381   °Psychological Services °Organization         Address  Phone  Notes  °[facility]  336- 832-9600   °[facility]  336- 378-7881   °[facility] [address], [place] 1-800-853-5163 or 336-641-4981   ° °Mobile Crisis Teams °Organization         Address  Phone  Notes  °[facility], Mobile Crisis Care Unit  1-877-626-1772   °Assertive °[facility] ° [address] [place], [place] 336-834-9664   °Sharon DeEsch [address] °[place] 336-554-5454   ° °Self-Help/Support Groups °Organization         Address  Phone             Notes  °[facility] - variety of support groups  336- 373-1402 Call for more information  °Narcotics Anonymous (NA), [facility] [address], °[place]  2 meetings at this location  ° °Residential Treatment Programs °Organization         Address  Phone  Notes  °[facility] [address],    °[place]  1-866-801-8205   °[facility] ° [address], [place], [place] 704-293-8524   °[facility] [address], [place] 336-845-3988 Admissions: 8am-3pm M-F  °[facility] [address]    °[place], [place] 336-841-1104   °[facility] [address], [place], [place] 336-379-7146   °[facility] [address]  °[place], [place] 336-285-9073   °[facility] - Intensive Outpatient [address], , [place] 336-852-3033   °[facility] [address]  °[place], [place] 1-877-615-2722 or 336-784-9470   °[facility] [address] [place], [place] 336-227-7417 Accepts Medicaid  °[facility] [address]  ° [place] 1-800-659-3381 Substance Abuse/Addiction Treatment  ° °[place] Behavioral Health Resources °Organization         Address  Phone  Notes  °[facility]  (888) 581-9988   °Julie Brannon, PhD [address] [place], [place]   (336) 349-5553 or (336) 951-0000   °[facility]   [address] °[place], [place] (336) 349-4454   °[facility] [address], [place],  (336) 342-8316 Insurance/Medicaid/sponsorship through [facility]  °[facility] [address]                                    [place],  (336) 342-8316 Therapy/tele-psych/case  °[facility] [address]  ° [place],  (336) 349-2233    °Dr.   Arfeen  (336) 349-4544   °[facility] [place]  [employer] [place] [facility] 1) [address], [place] °2) [address], [place] °3)  [address], [place] (336) 349-3220 °(336) 342-7768 ° °(336) 342-8140   °[place] [facility] (336) 342-1394 or (336) 342-3537 (After Hours)    ° ° ° °

## 2015-10-23 NOTE — ED Notes (Signed)
Pt reports took 1 mg ( 0.5 mg x 2 ) of klonopin, heroin, and ETOH. Pt drowsy, must be redirected and asked questions multiple attempts but able to answer questions appropriately. Pt a/o x4.

## 2015-10-24 MED ORDER — ONDANSETRON HCL 4 MG/2ML IJ SOLN
4.0000 mg | Freq: Once | INTRAMUSCULAR | Status: AC
  Administered 2015-10-24: 4 mg via INTRAVENOUS

## 2015-10-24 MED ORDER — ONDANSETRON HCL 4 MG/2ML IJ SOLN
INTRAMUSCULAR | Status: AC
  Filled 2015-10-24: qty 2

## 2015-11-04 NOTE — ED Provider Notes (Signed)
CSN: [id number]     Arrival date & time 10/23/15  1841 History   First MD Initiated Contact with Patient 10/23/15 1900     Chief Complaint  Patient presents with  . heroin OD      (Consider location/radiation/quality/duration/timing/severity/associated sxs/prior Treatment) HPI   38yF with drug overdose. Ems called for cardiac arrest. Pt found unresponsive with poor respiratory effort. Unsure how long down or last seen normal. Brisk clinical response to 2mg  narcan. Now drowsy but arousable to voice. Answers questions and follows simple commands. Admits to heroin, klonopin and alcohol use. Denies trying to harm self. C/o n/v .   Past Medical History  Diagnosis Date  . No pertinent past medical history    Past Surgical History  Procedure Laterality Date  . Cesarean section     Family History  Problem Relation Age of Onset  . Anesthesia problems Neg Hx    Social History  Substance Use Topics  . Smoking status: Never Smoker   . Smokeless tobacco: None  . Alcohol Use: Yes   OB History    Gravida Para Term Preterm AB TAB SAB Ectopic Multiple Living   9 5 4 1 3  0 3 0 0 4     Review of Systems   All systems reviewed and negative, other than as noted in HPI.  Allergies  Review of patient's allergies indicates no known allergies.  Home Medications   Prior to Admission medications   Medication Sig Start Date End Date Taking? Authorizing Provider  clonazePAM (KLONOPIN) 0.5 MG tablet TK 1 T PO BID PRN 10/14/15  Yes Historical Provider, MD  levothyroxine (SYNTHROID, LEVOTHROID) 25 MCG tablet TK 1 T PO QD 10/14/15  Yes Historical Provider, MD  desvenlafaxine (PRISTIQ) 50 MG 24 hr tablet Take 50 mg by mouth daily.  10/05/14   Historical Provider, MD  HYDROcodone-acetaminophen (NORCO/VICODIN) 5-325 MG tablet TK 1 T PO Q 4 TO 6 H 09/09/15   Historical Provider, MD  Prenatal Vit-Fe Fumarate-FA (PRENATAL MULTIVITAMIN) TABS Take 1 tablet by mouth at bedtime.    Historical Provider, MD    BP 103/61 mmHg  Pulse 104  Temp(Src) 98.2 F (36.8 C) (Oral)  Resp 23  SpO2 96%  LMP  Physical Exam  Constitutional:  Laying on side in bed. Retching/vomiting. Opens eyes to voice and follows simple commands.  HENT:  Head: Normocephalic and atraumatic.  Eyes: Conjunctivae are normal. Right eye exhibits no discharge. Left eye exhibits no discharge.  Neck: Neck supple.  Cardiovascular: Normal rate, regular rhythm and normal heart sounds.  Exam reveals no gallop and no friction rub.   No murmur heard. Pulmonary/Chest: Effort normal. No respiratory distress. She has wheezes. She exhibits tenderness.  Faint b/l wheezing noted. No increased wob. ttp across precordium. No crepitus. No concerning skin lesions.  Abdominal: Soft. She exhibits no distension. There is no tenderness.  Musculoskeletal: She exhibits no edema or tenderness.  No external signs of acute trauma.  Neurological:  Drowsy. No focal deficits noted otherwise.  Skin: Skin is warm and dry. She is not diaphoretic.  Psychiatric: She has a normal mood and affect. Her behavior is normal. Thought content normal.  Nursing note and vitals reviewed.   ED Course  Procedures (including critical care time) Labs Review Labs Reviewed  COMPREHENSIVE METABOLIC PANEL - Abnormal; Notable for the following:    Potassium 3.2 (*)    Calcium 8.8 (*)    Total Protein 8.3 (*)    AST 79 (*)  ALT 91 (*)    All other components within normal limits  ETHANOL - Abnormal; Notable for the following:    Alcohol, Ethyl (B) 28 (*)    All other components within normal limits  ACETAMINOPHEN LEVEL - Abnormal; Notable for the following:    Acetaminophen (Tylenol), Serum <10 (*)    All other components within normal limits  CBC - Abnormal; Notable for the following:    WBC 13.0 (*)    RDW 16.4 (*)    All other components within normal limits  URINE RAPID DRUG SCREEN, HOSP PERFORMED - Abnormal; Notable for the following:    Opiates POSITIVE  (*)    Cocaine POSITIVE (*)    Benzodiazepines POSITIVE (*)    All other components within normal limits  SALICYLATE LEVEL  CBG MONITORING, ED  I-STAT BETA HCG BLOOD, ED ([facility], [facility], [facility] ONLY)    Imaging Review No results found. I have personally reviewed and evaluated these images and lab results as part of my medical decision-making.   EKG Interpretation   Date/Time:  Wednesday October 23 2015 18:45:05 EDT Ventricular Rate:  92 PR Interval:  173 QRS Duration: 89 QT Interval:  407 QTC Calculation: 503 R Axis:   36 Text Interpretation:  Sinus rhythm Borderline prolonged QT interval ED  PHYSICIAN INTERPRETATION AVAILABLE IN [facility] Confirmed by TEST,  Record ([id number]) on 10/24/2015 7:13:36 AM      MDM   Final diagnoses:  Drug overdose, accidental or unintentional, initial encounter    38yF with non intentional drug overdose. She almost killed herself today. Arrived drowsy, but arrousable to voice and following commands. Actively vomiting likely from acute withdrawal after being given narcan.  Reproducible CP. No specific report of bystander CPR but EMS called for cardiac arrest. Suspect chest soreness may be from compressions. Imaging negative. Atypical for acs, pe, dissection or other emergent process. Unfortunately she declines opportunity to speak with anyone from TTS in terms of drug abuse. Denies si/hi. Has been observed and at this point in feel she is medically stable for discharge. She has medical decision making capability. Encouraged to seek help when she is ready.    Raeford Razor, MD 11/04/15 [phone]

## 2017-01-12 IMAGING — CR DG CHEST 2V
2 series · 2 of 2 positions shown · non-contrast
Comparison: Chest radiograph and CT chest 11/25/2014

CLINICAL DATA: Sternal pain after CPR.  Heroin overdose.

EXAM:
CHEST  2 VIEW

[w chest lat]
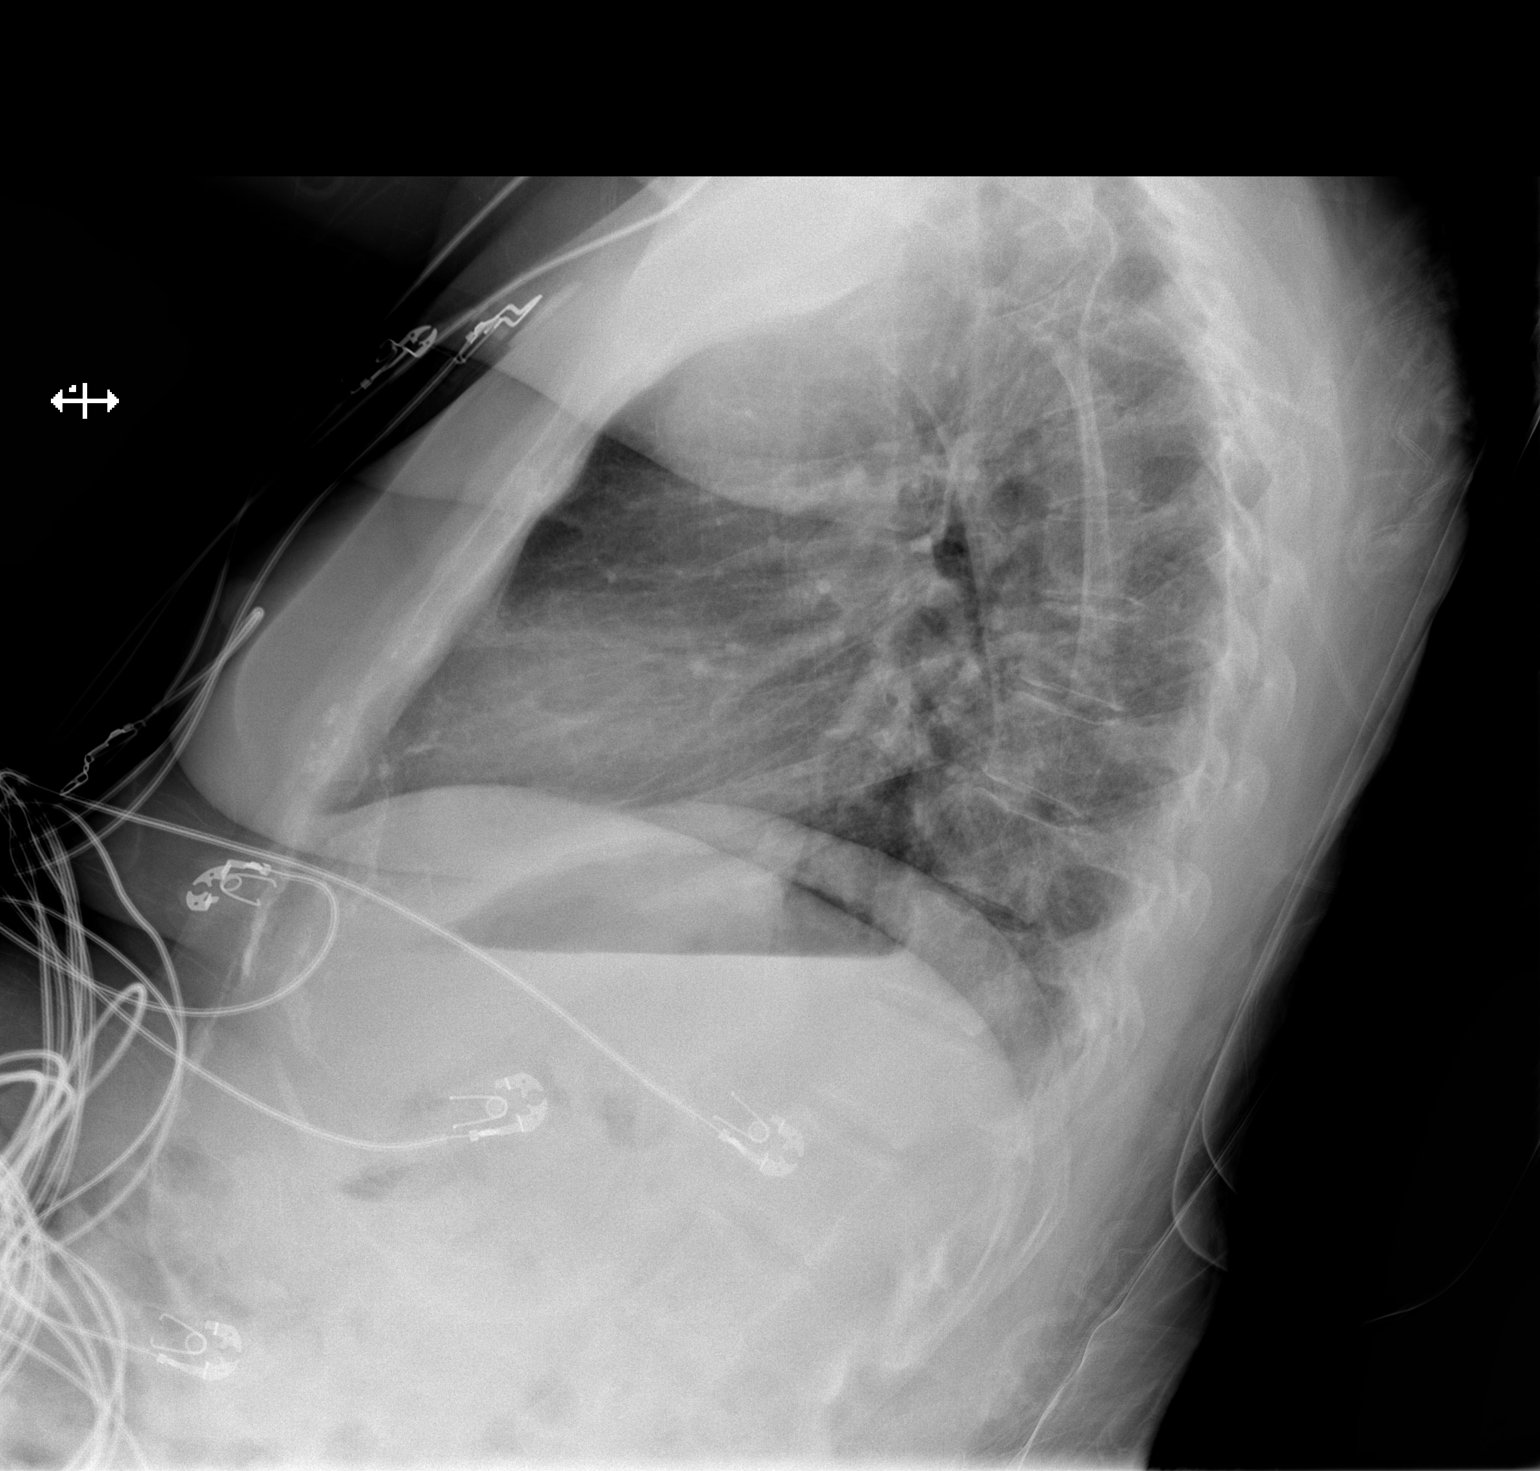

[x chest ap]
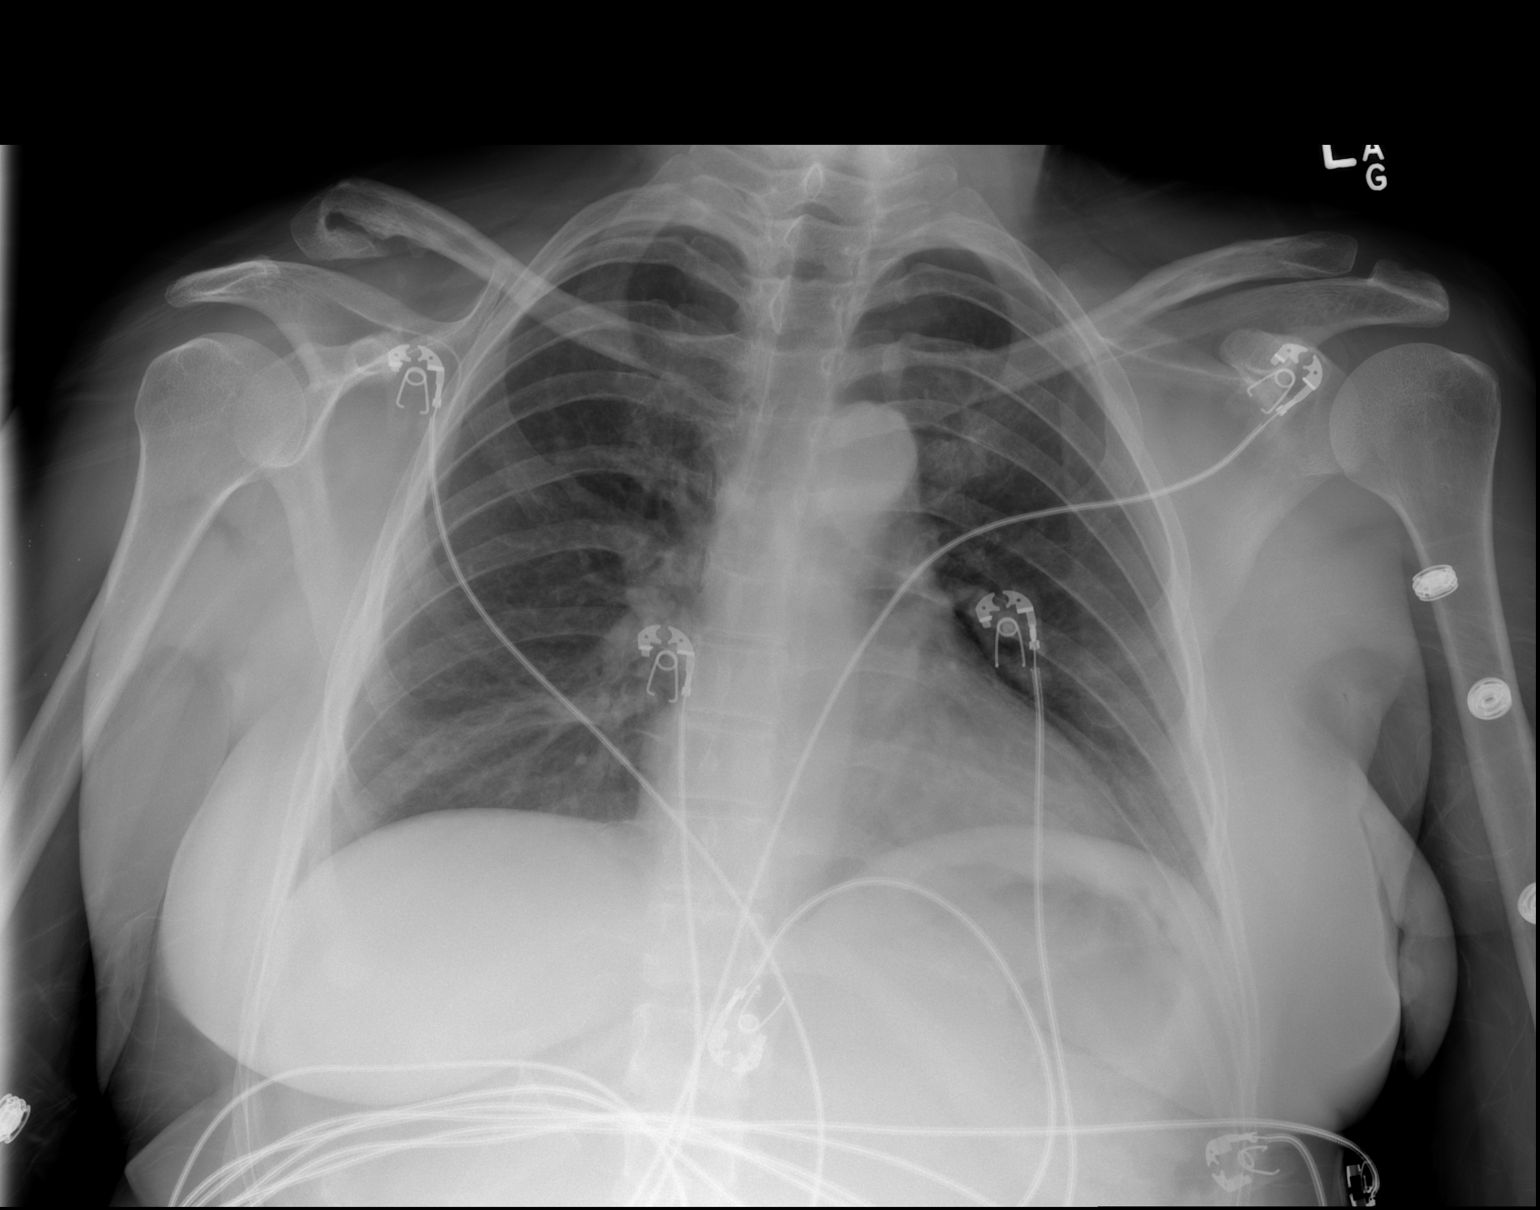

[2 of 2 positions shown; findings below may reference images not displayed]

FINDINGS: Shallow inspiration. Normal heart size and pulmonary vascularity. No
focal airspace disease or consolidation in the lungs. No
pneumothorax. No blunting of costophrenic angles. Mediastinal
contours appear intact. Old fracture versus resection of the distal
right clavicle with prominent osteophyte formation.
IMPRESSION: No active cardiopulmonary disease.

## 2022-09-27 ENCOUNTER — Other Ambulatory Visit: Payer: Self-pay

## 2022-09-27 ENCOUNTER — Emergency Department (HOSPITAL_COMMUNITY)
Admission: EM | Admit: 2022-09-27 | Discharge: 2022-09-28 | Disposition: A | Discharge status: Home / Self Care | Payer: Medicaid Other | Attending: Emergency Medicine | Admitting: Emergency Medicine

## 2022-09-27 ENCOUNTER — Emergency Department (HOSPITAL_COMMUNITY): Payer: Medicaid Other

## 2022-09-27 DIAGNOSIS — N9489 Other specified conditions associated with female genital organs and menstrual cycle: Secondary | ICD-10-CM | POA: Diagnosis not present

## 2022-09-27 DIAGNOSIS — R350 Frequency of micturition: Secondary | ICD-10-CM | POA: Diagnosis not present

## 2022-09-27 DIAGNOSIS — R509 Fever, unspecified: Secondary | ICD-10-CM | POA: Diagnosis not present

## 2022-09-27 DIAGNOSIS — R109 Unspecified abdominal pain: Secondary | ICD-10-CM | POA: Diagnosis not present

## 2022-09-27 DIAGNOSIS — R3915 Urgency of urination: Secondary | ICD-10-CM | POA: Insufficient documentation

## 2022-09-27 DIAGNOSIS — R300 Dysuria: Secondary | ICD-10-CM | POA: Insufficient documentation

## 2022-09-27 DIAGNOSIS — R110 Nausea: Secondary | ICD-10-CM | POA: Diagnosis not present

## 2022-09-27 LAB — CBC WITH DIFFERENTIAL/PLATELET
Abs Immature Granulocytes: 0.06 10*3/uL (ref 0.00–0.07)
Basophils Absolute: 0 10*3/uL (ref 0.0–0.1)
Basophils Relative: 0 %
Eosinophils Absolute: 0 10*3/uL (ref 0.0–0.5)
Eosinophils Relative: 0 %
HCT: 35 % — ABNORMAL LOW (ref 36.0–46.0)
Hemoglobin: 10.8 g/dL — ABNORMAL LOW (ref 12.0–15.0)
Immature Granulocytes: 1 %
Lymphocytes Relative: 16 %
Lymphs Abs: 1.8 10*3/uL (ref 0.7–4.0)
MCH: 24.8 pg — ABNORMAL LOW (ref 26.0–34.0)
MCHC: 30.9 g/dL (ref 30.0–36.0)
MCV: 80.5 fL (ref 80.0–100.0)
Monocytes Absolute: 1.3 10*3/uL — ABNORMAL HIGH (ref 0.1–1.0)
Monocytes Relative: 12 %
Neutro Abs: 8.3 10*3/uL — ABNORMAL HIGH (ref 1.7–7.7)
Neutrophils Relative %: 71 %
Platelets: 312 10*3/uL (ref 150–400)
RBC: 4.35 MIL/uL (ref 3.87–5.11)
RDW: 14.5 % (ref 11.5–15.5)
WBC: 11.5 10*3/uL — ABNORMAL HIGH (ref 4.0–10.5)
nRBC: 0 % (ref 0.0–0.2)

## 2022-09-27 LAB — URINALYSIS, ROUTINE W REFLEX MICROSCOPIC
Bilirubin Urine: NEGATIVE
Glucose, UA: NEGATIVE mg/dL
Ketones, ur: NEGATIVE mg/dL
Nitrite: POSITIVE — AB
Protein, ur: 30 mg/dL — AB
Specific Gravity, Urine: 1.013 (ref 1.005–1.030)
WBC, UA: >50 WBC/hpf — ABNORMAL HIGH (ref 0–5)
pH: 5 (ref 5.0–8.0)

## 2022-09-27 LAB — BASIC METABOLIC PANEL
Anion gap: 7 (ref 5–15)
BUN: 7 mg/dL (ref 6–20)
CO2: 26 mmol/L (ref 22–32)
Calcium: 8.6 mg/dL — ABNORMAL LOW (ref 8.9–10.3)
Chloride: 102 mmol/L (ref 98–111)
Creatinine, Ser: 0.75 mg/dL (ref 0.44–1.00)
GFR, Estimated: >60 mL/min (ref >60)
Glucose, Bld: 151 mg/dL — ABNORMAL HIGH (ref 70–99)
Potassium: 3 mmol/L — ABNORMAL LOW (ref 3.5–5.1)
Sodium: 135 mmol/L (ref 135–145)

## 2022-09-27 LAB — I-STAT BETA HCG BLOOD, ED (MC, WL, AP ONLY): I-stat hCG, quantitative: <5 m[IU]/mL (ref <5)

## 2022-09-27 NOTE — Care Plan (Signed)
CT waiting on HCG results

## 2022-09-27 NOTE — ED Triage Notes (Signed)
Pt here for UTI symptoms that has been going on for 3 weeks, pt now has R flank pain fevers, dizziness and nausea. Pt has not been seen for this, not on any antibiotics.

## 2022-09-27 NOTE — ED Provider Triage Note (Signed)
Emergency Medicine Provider Triage Evaluation Note  Becky Jennings , a 46 y.o. female  was evaluated in triage.  Pt complains of right flank pain x2 weeks.  Associate with dysuria, increased urinary frequency.  Denies any hematuria, a flank pain is constant and worse with movement.  Denies any history of kidney stones, has not noticed hematuria.  Patient endorses fever of 105 yesterday at home.  Nausea but no vomiting..  Review of Systems  Per HPI  Physical Exam  BP (!) 154/99   Pulse (!) 116   Temp 98.3 F (36.8 C)   Resp 20   SpO2 100%  Gen:   Awake, no distress   Resp:  Normal effort  MSK:   Moves extremities without difficulty  Other:  No suprapubic tenderness, right CVA tenderness.  Mildly tachycardic with regular rhythm upper and lower extremity pulses 2+ symmetric bilaterally abdomen is soft nontender  Medical Decision Making  Medically screening exam initiated at 8:04 PM.  Appropriate orders placed.  Becky Jennings was informed that the remainder of the evaluation will be completed by another provider, this initial triage assessment does not replace that evaluation, and the importance of remaining in the ED until their evaluation is complete.     Sherrill Raring, PA-C 09/27/22 2005

## 2022-09-28 ENCOUNTER — Other Ambulatory Visit (HOSPITAL_COMMUNITY): Payer: Self-pay

## 2022-09-28 MED ORDER — SODIUM CHLORIDE 0.9 % IV SOLN
1.0000 g | Freq: Once | INTRAVENOUS | Status: AC
  Administered 2022-09-28: 1 g via INTRAVENOUS
  Filled 2022-09-28: qty 10

## 2022-09-28 MED ORDER — SODIUM CHLORIDE 0.9 % IV BOLUS
500.0000 mL | Freq: Once | INTRAVENOUS | Status: AC
Start: 2022-09-28 — End: 2022-09-28
  Administered 2022-09-28: 500 mL via INTRAVENOUS

## 2022-09-28 MED ORDER — KETOROLAC TROMETHAMINE 30 MG/ML IJ SOLN
15.0000 mg | Freq: Once | INTRAMUSCULAR | Status: AC
  Administered 2022-09-28: 15 mg via INTRAVENOUS
  Filled 2022-09-28: qty 1

## 2022-09-28 MED ORDER — THIAMINE MONONITRATE 100 MG PO TABS
100.0000 mg | ORAL_TABLET | Freq: Every day | ORAL | Status: DC
  Administered 2022-09-28: 100 mg via ORAL
  Filled 2022-09-28: qty 1

## 2022-09-28 MED ORDER — OXYCODONE HCL 5 MG PO TABS
5.0000 mg | ORAL_TABLET | Freq: Once | ORAL | Status: AC
  Administered 2022-09-28: 5 mg via ORAL
  Filled 2022-09-28: qty 1

## 2022-09-28 MED ORDER — CIPROFLOXACIN HCL 500 MG PO TABS
500.0000 mg | ORAL_TABLET | Freq: Two times a day (BID) | ORAL | 0 refills | Status: AC
  Filled 2022-09-28: qty 20, 10d supply, fill #0

## 2022-09-28 MED ORDER — ACETAMINOPHEN 500 MG PO TABS
1000.0000 mg | ORAL_TABLET | Freq: Once | ORAL | Status: AC
  Administered 2022-09-28: 1000 mg via ORAL
  Filled 2022-09-28: qty 2

## 2022-09-28 MED ORDER — POTASSIUM CHLORIDE CRYS ER 20 MEQ PO TBCR
40.0000 meq | EXTENDED_RELEASE_TABLET | Freq: Once | ORAL | Status: AC
  Administered 2022-09-28: 40 meq via ORAL
  Filled 2022-09-28: qty 2

## 2022-09-28 NOTE — ED Notes (Signed)
Patient in hallway with female friend. Patient asking when breakfast is. Patient was told that breakfast trays had been brought around this morning. Patient then asked when her lunch would be brought.

## 2022-09-28 NOTE — ED Provider Notes (Signed)
[facility] EMERGENCY DEPARTMENT Provider Note   CSN: [id number] Arrival date & time: 09/27/22  1918     History  Chief Complaint  Patient presents with   Flank Pain   Dysuria    Becky Jennings is a 46 y.o. female.  46 year old female presents to the emergency department for evaluation of 2 weeks of right-sided flank pain.  Pain has been constant and has been gradually worsening.  She notes associated dysuria as well as urinary frequency and urgency.  She has had some nausea as well as subjective fever, but denies any vomiting.  No hematuria, vaginal complaints, history of kidney stones.  The history is provided by the patient. No language interpreter was used.  Flank Pain  Dysuria Associated symptoms: flank pain        Home Medications Prior to Admission medications   Medication Sig Start Date End Date Taking? Authorizing Provider  clonazePAM (KLONOPIN) 0.5 MG tablet TK 1 T PO BID PRN 10/14/15   [provider]  desvenlafaxine (PRISTIQ) 50 MG 24 hr tablet Take 50 mg by mouth daily.  10/05/14   [provider]  HYDROcodone-acetaminophen (NORCO/VICODIN) 5-325 MG tablet TK 1 T PO Q 4 TO 6 H 09/09/15   [provider]  levothyroxine (SYNTHROID, LEVOTHROID) 25 MCG tablet TK 1 T PO QD 10/14/15   [provider]  Prenatal Vit-Fe Fumarate-FA (PRENATAL MULTIVITAMIN) TABS Take 1 tablet by mouth at bedtime.    [provider]      Allergies    Patient has no known allergies.    Review of Systems   Review of Systems  Genitourinary:  Positive for dysuria and flank pain.  Ten systems reviewed and are negative for acute change, except as noted in the HPI.    Physical Exam Updated Vital Signs BP (!) 150/74   Pulse 100   Temp 98.1 F (36.7 C)   Resp 18   SpO2 100%   Physical Exam Vitals and nursing note reviewed.  Constitutional:      General: She is not in acute distress.    Appearance: She is  well-developed. She is not diaphoretic.     Comments: Nontoxic appearing and in NAD  HENT:     Head: Normocephalic and atraumatic.  Eyes:     General: No scleral icterus.    Conjunctiva/sclera: Conjunctivae normal.  Pulmonary:     Effort: Pulmonary effort is normal. No respiratory distress.     Comments: Respirations even and unlabored Abdominal:     Palpations: Abdomen is soft.  Musculoskeletal:        General: Normal range of motion.     Cervical back: Normal range of motion.  Skin:    General: Skin is warm and dry.     Coloration: Skin is not pale.     Findings: No erythema or rash.  Neurological:     Mental Status: She is alert and oriented to person, place, and time.  Psychiatric:        Behavior: Behavior normal.     ED Results / Procedures / Treatments   Labs (all labs ordered are listed, but only abnormal results are displayed) Labs Reviewed  CBC WITH DIFFERENTIAL/PLATELET - Abnormal; Notable for the following components:      Result Value   WBC 11.5 (*)    Hemoglobin 10.8 (*)    HCT 35.0 (*)    MCH 24.8 (*)    Neutro Abs 8.3 (*)    Monocytes  Absolute 1.3 (*)    All other components within normal limits  BASIC METABOLIC PANEL - Abnormal; Notable for the following components:   Potassium 3.0 (*)    Glucose, Bld 151 (*)    Calcium 8.6 (*)    All other components within normal limits  URINALYSIS, ROUTINE W REFLEX MICROSCOPIC - Abnormal; Notable for the following components:   APPearance CLOUDY (*)    Hgb urine dipstick MODERATE (*)    Protein, ur 30 (*)    Nitrite POSITIVE (*)    Leukocytes,Ua LARGE (*)    WBC, UA >50 (*)    Bacteria, UA FEW (*)    All other components within normal limits  URINE CULTURE  I-STAT BETA HCG BLOOD, ED ([facility], [facility], [facility] ONLY)    EKG None  Radiology CT Renal Stone Study  Result Date: 09/27/2022 CLINICAL DATA:  Right flank pain and fever. EXAM: CT ABDOMEN AND PELVIS WITHOUT CONTRAST TECHNIQUE: Multidetector CT imaging of the  abdomen and pelvis was performed following the standard protocol without IV contrast. RADIATION DOSE REDUCTION: This exam was performed according to the departmental dose-optimization program which includes automated exposure control, adjustment of the mA and/or kV according to patient size and/or use of iterative reconstruction technique. COMPARISON:  None Available. FINDINGS: Lower chest: No acute abnormality. Hepatobiliary: No focal liver abnormality is seen. No gallstones, gallbladder wall thickening, or biliary dilatation. Pancreas: Unremarkable. No pancreatic ductal dilatation or surrounding inflammatory changes. Spleen: Normal in size without focal abnormality. Adrenals/Urinary Tract: Adrenal glands are unremarkable. Kidneys are normal, without renal calculi, focal lesion, or hydronephrosis. There is mild right-sided perinephric inflammatory fat stranding. Bladder is unremarkable. Stomach/Bowel: Stomach is within normal limits. Appendix appears normal. No evidence of bowel wall thickening, distention, or inflammatory changes. Vascular/Lymphatic: No significant vascular findings are present. No enlarged abdominal or pelvic lymph nodes. Reproductive: Uterus and bilateral adnexa are unremarkable. Other: No abdominal wall hernia or abnormality. No abdominopelvic ascites. Musculoskeletal: No acute or significant osseous findings. IMPRESSION: Mild right-sided perinephric inflammatory fat stranding, which may represent sequelae associated with acute pyelonephritis. Correlation with urinalysis is recommended. Electronically Signed   By: Aram Candela M.D.   On: 09/27/2022 21:09    Procedures Procedures    Medications Ordered in ED Medications  oxyCODONE (Oxy IR/ROXICODONE) immediate release tablet 5 mg (has no administration in time range)  potassium chloride SA (KLOR-CON M) CR tablet 40 mEq (has no administration in time range)  thiamine (VITAMIN B1) tablet 100 mg (has no administration in time range)   cefTRIAXone (ROCEPHIN) 1 g in sodium chloride 0.9 % 100 mL IVPB (0 g Intravenous Stopped 09/28/22 0434)  ketorolac (TORADOL) 30 MG/ML injection 15 mg (15 mg Intravenous Given 09/28/22 0351)  acetaminophen (TYLENOL) tablet 1,000 mg (1,000 mg Oral Given 09/28/22 0347)  sodium chloride 0.9 % bolus 500 mL (0 mLs Intravenous Stopped 09/28/22 0434)    ED Course/ Medical Decision Making/ A&P Clinical Course as of 10/15/22 1545  Mon Sep 28, 2022  0438 Patient states that she is homeless and an alcoholic and she does not have the means to pay for her medication or to go to a pharmacy.  Consult placed to transition of care team to try and provide medication assistance to ensure resolution of pyelonephritis. [KH]    Clinical Course User Index [KH] Antony Madura, PA-C                           Medical Decision Making  Amount and/or Complexity of Data Reviewed Labs: ordered.  Risk OTC drugs. Prescription drug management.   This patient presents to the ED for concern of R flank pain, this involves an extensive number of treatment options, and is a complaint that carries with it a high risk of complications and morbidity.  The differential diagnosis includes pyelonephritis vs MSK vs kidney stone vs biliary pathology vs RLL PNA   Co morbidities that complicate the patient evaluation  ETOH abuse   Additional history obtained:  Additional history obtained from partner at bedside   Lab Tests:  I Ordered, and personally interpreted labs.  The pertinent results include:  Leukocytosis of 11.5 w/left shift, hypokalemia of 3.0, UA with pyuria and microscopic hematuria w/nitrites   Imaging Studies ordered:  I ordered imaging studies including CT abdomen/pelvis  I independently visualized and interpreted imaging which showed right perinephric stranding c/w pyelonephritis. No kidney stones I agree with the radiologist interpretation   Cardiac Monitoring:  The patient was maintained on a cardiac  monitor.  I personally viewed and interpreted the cardiac monitored which showed an underlying rhythm of: sinus tachycardia > NSR   Medicines ordered and prescription drug management:  I ordered medication including PO potassium for hypokalemia, IV Rocephin for pyelonephritis treatment, and IV Toradol for pain  Reevaluation of the patient after these medicines showed that the patient improved I have reviewed the patients home medicines and have made adjustments as needed   Test Considered:  GC/Chlamydia testing   Critical Interventions:  IV abx for treatment of pyelonephritis   Consultations Obtained:  I requested consultation with the transitions of care team for assistance with prescriptions medications as patient is homeless and unable to afford abx for treatment   Problem List / ED Course:  As above   Reevaluation:  After the interventions noted above, I reevaluated the patient and found that they have :improved   Social Determinants of Health:  Homelessness   Dispostion:  After consideration of the diagnostic results and the patients response to treatment, I feel that the patent would benefit from outpatient course of abx for treatment of pyelonephritis. Encourage follow up with a primary care doctor. Return precautions discussed and provided. Patient discharged in stable condition with no unaddressed concerns.         Final Clinical Impression(s) / ED Diagnoses Final diagnoses:  Right flank pain    Rx / DC Orders ED Discharge Orders          Ordered    ciprofloxacin (CIPRO) 500 MG tablet  2 times daily        09/28/22 0925              Antony Madura, PA-C 10/15/22 1554    Tilden Fossa, MD 10/18/22 2256

## 2022-09-28 NOTE — ED Provider Notes (Signed)
Patient was received at shift handoff. [name] is 46 y.o. female presenting with right flank pain for past 2-3 weeks. CT renal showed pyelonephritis correlated with findings on UA. She is currently without a home. She states she is unable to afford medications. Please refer to ED H&P for more information.    Physical Exam  BP 99/62 (BP Location: Right Arm)   Pulse 72   Temp 98.6 F (37 C) (Oral)   Resp 16   SpO2 100%   Physical Exam Constitutional:      General: She is not in acute distress. HENT:     Head: Normocephalic and atraumatic.  Cardiovascular:     Rate and Rhythm: Normal rate and regular rhythm.  Pulmonary:     Effort: Pulmonary effort is normal.  Abdominal:     Tenderness: There is right CVA tenderness.  Neurological:     Mental Status: She is alert.     Procedures  Procedures  ED Course / MDM   Clinical Course as of 09/28/22 1024  Mon Sep 28, 2022  8413 Patient states that she is homeless and an alcoholic and she does not have the means to pay for her medication or to go to a pharmacy.  Consult placed to transition of care team to try and provide medication assistance to ensure resolution of pyelonephritis. [KH]    Clinical Course User Index [KH] Antony Madura, PA-C   Medical Decision Making Amount and/or Complexity of Data Reviewed Labs: ordered.  Risk OTC drugs. Prescription drug management.  Patient presents with right flank pain. UA showed UTI. CT renal showed pyelonephritis. She was given a dose of Rocephin. Pain improved with pain medication. She was afebrile and other vitals were hemodynamically stable. Mild leukocytosis. She is stable for discharge with oral antibiotics. Sent prescription to [facility] pharmacy. Return precautions given.        Rana Snare, DO 09/28/22 1028    Lorre Nick, MD 09/29/22 [phone]

## 2022-09-28 NOTE — Discharge Instructions (Addendum)
You were seen for right flank pain. Labs and imaging show kidney infection and UTI. Prescription sent for antibiotics. Please take the antibiotics twice a day for 10 days. If your symptoms worsen, please return to the emergency room.

## 2022-09-29 LAB — URINE CULTURE

## 2022-09-30 LAB — URINE CULTURE: Culture: >=100000 — AB

## 2022-10-01 ENCOUNTER — Telehealth (HOSPITAL_BASED_OUTPATIENT_CLINIC_OR_DEPARTMENT_OTHER): Payer: Self-pay | Admitting: *Deleted

## 2022-10-01 NOTE — Telephone Encounter (Signed)
Post ED Visit - Positive Culture Follow-up  Culture report reviewed by antimicrobial stewardship pharmacist: [place] Team []  Elenor Quinones, Pharm.D. []  Heide Guile, Pharm.D., BCPS AQ-ID []  Parks Neptune, Pharm.D., BCPS []  Alycia Rossetti, Pharm.D., BCPS []  Allen, Pharm.D., BCPS, AAHIVP []  Legrand Como, Pharm.D., BCPS, AAHIVP []  Salome Arnt, PharmD, BCPS []  Johnnette Gourd, PharmD, BCPS []  Hughes Better, PharmD, BCPS []  Leeroy Cha, PharmD []  Laqueta Linden, PharmD, BCPS []  Albertina Parr, PharmD  [place] Team []  Leodis Sias, PharmD []  Lindell Spar, PharmD []  Royetta Asal, PharmD []  Graylin Shiver, Rph []  Rema Fendt) Glennon Mac, PharmD []  Arlyn Dunning, PharmD []  Netta Cedars, PharmD []  Dia Sitter, PharmD []  Leone Haven, PharmD []  Gretta Arab, PharmD []  Theodis Shove, PharmD []  Peggyann Juba, PharmD []  Reuel Boom, PharmD   Positive urine culture Treated with Ciprofloxacin, organism sensitive to the same and no further patient follow-up is required at this time. Louanne Belton, PharmD Harlon Flor Talley 10/01/2022, 10:32 AM

## 2022-10-28 ENCOUNTER — Other Ambulatory Visit (HOSPITAL_COMMUNITY): Payer: Self-pay

## 2023-03-05 ENCOUNTER — Encounter (HOSPITAL_COMMUNITY): Payer: Self-pay

## 2023-03-05 ENCOUNTER — Observation Stay (HOSPITAL_BASED_OUTPATIENT_CLINIC_OR_DEPARTMENT_OTHER): Payer: Medicaid Other

## 2023-03-05 ENCOUNTER — Inpatient Hospital Stay (HOSPITAL_COMMUNITY)
Admission: EM | Admit: 2023-03-05 | Discharge: 2023-03-07 | DRG: 917 | Disposition: A | Discharge status: Home / Self Care | Payer: Medicaid Other | Attending: Internal Medicine | Admitting: Internal Medicine

## 2023-03-05 ENCOUNTER — Emergency Department (HOSPITAL_COMMUNITY): Payer: Medicaid Other

## 2023-03-05 ENCOUNTER — Other Ambulatory Visit: Payer: Self-pay

## 2023-03-05 DIAGNOSIS — E038 Other specified hypothyroidism: Secondary | ICD-10-CM | POA: Diagnosis present

## 2023-03-05 DIAGNOSIS — D75839 Thrombocytosis, unspecified: Secondary | ICD-10-CM | POA: Diagnosis present

## 2023-03-05 DIAGNOSIS — I161 Hypertensive emergency: Secondary | ICD-10-CM | POA: Diagnosis not present

## 2023-03-05 DIAGNOSIS — J9601 Acute respiratory failure with hypoxia: Secondary | ICD-10-CM

## 2023-03-05 DIAGNOSIS — I517 Cardiomegaly: Secondary | ICD-10-CM

## 2023-03-05 DIAGNOSIS — Z91148 Patient's other noncompliance with medication regimen for other reason: Secondary | ICD-10-CM

## 2023-03-05 DIAGNOSIS — F419 Anxiety disorder, unspecified: Secondary | ICD-10-CM | POA: Diagnosis present

## 2023-03-05 DIAGNOSIS — Z1152 Encounter for screening for COVID-19: Secondary | ICD-10-CM

## 2023-03-05 DIAGNOSIS — J810 Acute pulmonary edema: Secondary | ICD-10-CM | POA: Diagnosis not present

## 2023-03-05 DIAGNOSIS — F1410 Cocaine abuse, uncomplicated: Secondary | ICD-10-CM

## 2023-03-05 DIAGNOSIS — F329 Major depressive disorder, single episode, unspecified: Secondary | ICD-10-CM | POA: Diagnosis present

## 2023-03-05 DIAGNOSIS — I110 Hypertensive heart disease with heart failure: Secondary | ICD-10-CM | POA: Diagnosis present

## 2023-03-05 DIAGNOSIS — I5021 Acute systolic (congestive) heart failure: Secondary | ICD-10-CM | POA: Diagnosis present

## 2023-03-05 DIAGNOSIS — D72829 Elevated white blood cell count, unspecified: Secondary | ICD-10-CM | POA: Diagnosis present

## 2023-03-05 DIAGNOSIS — T405X1A Poisoning by cocaine, accidental (unintentional), initial encounter: Principal | ICD-10-CM | POA: Diagnosis present

## 2023-03-05 DIAGNOSIS — D509 Iron deficiency anemia, unspecified: Secondary | ICD-10-CM | POA: Diagnosis present

## 2023-03-05 LAB — I-STAT VENOUS BLOOD GAS, ED
Acid-Base Excess: 2 mmol/L (ref 0.0–2.0)
Acid-Base Excess: 1 mmol/L (ref 0.0–2.0)
Bicarbonate: 25.9 mmol/L (ref 20.0–28.0)
Bicarbonate: 26.1 mmol/L (ref 20.0–28.0)
Calcium, Ion: 1 mmol/L — ABNORMAL LOW (ref 1.15–1.40)
Calcium, Ion: 1.07 mmol/L — ABNORMAL LOW (ref 1.15–1.40)
HCT: 30 % — ABNORMAL LOW (ref 36.0–46.0)
HCT: 30 % — ABNORMAL LOW (ref 36.0–46.0)
Hemoglobin: 10.2 g/dL — ABNORMAL LOW (ref 12.0–15.0)
Hemoglobin: 10.2 g/dL — ABNORMAL LOW (ref 12.0–15.0)
O2 Saturation: 98 %
O2 Saturation: 70 %
Potassium: 4.5 mmol/L (ref 3.5–5.1)
Potassium: 3.7 mmol/L (ref 3.5–5.1)
Sodium: 135 mmol/L (ref 135–145)
Sodium: 137 mmol/L (ref 135–145)
TCO2: 27 mmol/L (ref 22–32)
TCO2: 27 mmol/L (ref 22–32)
pCO2, Ven: 35.4 mmHg — ABNORMAL LOW (ref 44–60)
pCO2, Ven: 40.3 mmHg — ABNORMAL LOW (ref 44–60)
pH, Ven: 7.471 — ABNORMAL HIGH (ref 7.25–7.43)
pH, Ven: 7.419 (ref 7.25–7.43)
pO2, Ven: 93 mmHg — ABNORMAL HIGH (ref 32–45)
pO2, Ven: 36 mmHg (ref 32–45)

## 2023-03-05 LAB — ECHOCARDIOGRAM COMPLETE
Area-P 1/2: 5.84 cm2
Height: 60 in
S' Lateral: 3.4 cm
Weight: 2222.24 oz

## 2023-03-05 LAB — URINALYSIS, ROUTINE W REFLEX MICROSCOPIC
Bilirubin Urine: NEGATIVE
Glucose, UA: NEGATIVE mg/dL
Ketones, ur: NEGATIVE mg/dL
Leukocytes,Ua: NEGATIVE
Nitrite: NEGATIVE
Protein, ur: NEGATIVE mg/dL
Specific Gravity, Urine: 1.005 (ref 1.005–1.030)
pH: 7 (ref 5.0–8.0)

## 2023-03-05 LAB — CBC WITH DIFFERENTIAL/PLATELET
Abs Immature Granulocytes: 0.1 10*3/uL — ABNORMAL HIGH (ref 0.00–0.07)
Basophils Absolute: 0.1 10*3/uL (ref 0.0–0.1)
Basophils Relative: 0 %
Eosinophils Absolute: 0.1 10*3/uL (ref 0.0–0.5)
Eosinophils Relative: 1 %
HCT: 31.6 % — ABNORMAL LOW (ref 36.0–46.0)
Hemoglobin: 9.2 g/dL — ABNORMAL LOW (ref 12.0–15.0)
Immature Granulocytes: 1 %
Lymphocytes Relative: 11 %
Lymphs Abs: 1.7 10*3/uL (ref 0.7–4.0)
MCH: 23.7 pg — ABNORMAL LOW (ref 26.0–34.0)
MCHC: 29.1 g/dL — ABNORMAL LOW (ref 30.0–36.0)
MCV: 81.4 fL (ref 80.0–100.0)
Monocytes Absolute: 0.9 10*3/uL (ref 0.1–1.0)
Monocytes Relative: 6 %
Neutro Abs: 12.8 10*3/uL — ABNORMAL HIGH (ref 1.7–7.7)
Neutrophils Relative %: 81 %
Platelets: 406 10*3/uL — ABNORMAL HIGH (ref 150–400)
RBC: 3.88 MIL/uL (ref 3.87–5.11)
RDW: 16.5 % — ABNORMAL HIGH (ref 11.5–15.5)
WBC: 15.7 10*3/uL — ABNORMAL HIGH (ref 4.0–10.5)
nRBC: 0 % (ref 0.0–0.2)

## 2023-03-05 LAB — I-STAT ARTERIAL BLOOD GAS, ED
Acid-Base Excess: 3 mmol/L — ABNORMAL HIGH (ref 0.0–2.0)
Bicarbonate: 29 mmol/L — ABNORMAL HIGH (ref 20.0–28.0)
Calcium, Ion: 1.11 mmol/L — ABNORMAL LOW (ref 1.15–1.40)
HCT: 33 % — ABNORMAL LOW (ref 36.0–46.0)
Hemoglobin: 11.2 g/dL — ABNORMAL LOW (ref 12.0–15.0)
O2 Saturation: 99 %
Patient temperature: 97.7
Potassium: 3.2 mmol/L — ABNORMAL LOW (ref 3.5–5.1)
Sodium: 136 mmol/L (ref 135–145)
TCO2: 30 mmol/L (ref 22–32)
pCO2 arterial: 48.6 mmHg — ABNORMAL HIGH (ref 32–48)
pH, Arterial: 7.381 (ref 7.35–7.45)
pO2, Arterial: 132 mmHg — ABNORMAL HIGH (ref 83–108)

## 2023-03-05 LAB — RESP PANEL BY RT-PCR (RSV, FLU A&B, COVID)  RVPGX2
Influenza A by PCR: NEGATIVE
Influenza B by PCR: NEGATIVE
Resp Syncytial Virus by PCR: NEGATIVE
SARS Coronavirus 2 by RT PCR: NEGATIVE

## 2023-03-05 LAB — BRAIN NATRIURETIC PEPTIDE: B Natriuretic Peptide: 386 pg/mL — ABNORMAL HIGH (ref 0.0–100.0)

## 2023-03-05 LAB — CBG MONITORING, ED: Glucose-Capillary: 234 mg/dL — ABNORMAL HIGH (ref 70–99)

## 2023-03-05 LAB — LIPASE, BLOOD: Lipase: 38 U/L (ref 11–51)

## 2023-03-05 LAB — BLOOD GAS, VENOUS
Acid-Base Excess: 0.8 mmol/L (ref 0.0–2.0)
Bicarbonate: 26 mmol/L (ref 20.0–28.0)
Drawn by: 164
O2 Saturation: 39.8 %
Patient temperature: 37
pCO2, Ven: 43 mmHg — ABNORMAL LOW (ref 44–60)
pH, Ven: 7.39 (ref 7.25–7.43)
pO2, Ven: <31 mmHg — CL (ref 32–45)

## 2023-03-05 LAB — COMPREHENSIVE METABOLIC PANEL
ALT: 37 U/L (ref 0–44)
AST: 45 U/L — ABNORMAL HIGH (ref 15–41)
Albumin: 2.5 g/dL — ABNORMAL LOW (ref 3.5–5.0)
Alkaline Phosphatase: 107 U/L (ref 38–126)
Anion gap: 11 (ref 5–15)
BUN: 14 mg/dL (ref 6–20)
CO2: 20 mmol/L — ABNORMAL LOW (ref 22–32)
Calcium: 8 mg/dL — ABNORMAL LOW (ref 8.9–10.3)
Chloride: 103 mmol/L (ref 98–111)
Creatinine, Ser: 0.73 mg/dL (ref 0.44–1.00)
GFR, Estimated: >60 mL/min (ref >60)
Glucose, Bld: 129 mg/dL — ABNORMAL HIGH (ref 70–99)
Potassium: 3.8 mmol/L (ref 3.5–5.1)
Sodium: 134 mmol/L — ABNORMAL LOW (ref 135–145)
Total Bilirubin: 0.7 mg/dL (ref 0.3–1.2)
Total Protein: 6.9 g/dL (ref 6.5–8.1)

## 2023-03-05 LAB — MAGNESIUM: Magnesium: 1.8 mg/dL (ref 1.7–2.4)

## 2023-03-05 LAB — BLOOD GAS, ARTERIAL
Acid-Base Excess: 0.3 mmol/L (ref 0.0–2.0)
Bicarbonate: 24.6 mmol/L (ref 20.0–28.0)
Drawn by: 681121
O2 Saturation: 98.8 %
Patient temperature: 37.2
pCO2 arterial: 38 mmHg (ref 32–48)
pH, Arterial: 7.42 (ref 7.35–7.45)
pO2, Arterial: 97 mmHg (ref 83–108)

## 2023-03-05 LAB — PHOSPHORUS: Phosphorus: 3.8 mg/dL (ref 2.5–4.6)

## 2023-03-05 LAB — T4, FREE: Free T4: 0.74 ng/dL (ref 0.61–1.12)

## 2023-03-05 LAB — CK: Total CK: 93 U/L (ref 38–234)

## 2023-03-05 LAB — RAPID URINE DRUG SCREEN, HOSP PERFORMED
Amphetamines: NOT DETECTED
Barbiturates: NOT DETECTED
Benzodiazepines: NOT DETECTED
Cocaine: POSITIVE — AB
Opiates: NOT DETECTED
Tetrahydrocannabinol: NOT DETECTED

## 2023-03-05 LAB — TROPONIN I (HIGH SENSITIVITY)
Troponin I (High Sensitivity): 7 ng/L (ref <18)
Troponin I (High Sensitivity): 9 ng/L (ref <18)

## 2023-03-05 LAB — I-STAT BETA HCG BLOOD, ED (MC, WL, AP ONLY): I-stat hCG, quantitative: <5 m[IU]/mL (ref <5)

## 2023-03-05 LAB — TSH: TSH: 8.644 u[IU]/mL — ABNORMAL HIGH (ref 0.350–4.500)

## 2023-03-05 LAB — HIV ANTIBODY (ROUTINE TESTING W REFLEX): HIV Screen 4th Generation wRfx: NONREACTIVE

## 2023-03-05 LAB — ETHANOL: Alcohol, Ethyl (B): <10 mg/dL (ref <10)

## 2023-03-05 MED ORDER — ACETAMINOPHEN 650 MG RE SUPP: 650.0000 mg | Freq: Four times a day (QID) | RECTAL | Status: DC | PRN

## 2023-03-05 MED ORDER — ACETAMINOPHEN 325 MG PO TABS: 650.0000 mg | ORAL_TABLET | Freq: Four times a day (QID) | ORAL | Status: DC | PRN

## 2023-03-05 MED ORDER — LORAZEPAM 2 MG/ML IJ SOLN
1.0000 mg | Freq: Once | INTRAMUSCULAR | Status: AC
  Administered 2023-03-05: 1 mg via INTRAVENOUS
  Filled 2023-03-05: qty 1

## 2023-03-05 MED ORDER — SENNOSIDES-DOCUSATE SODIUM 8.6-50 MG PO TABS: 1.0000 | ORAL_TABLET | Freq: Every evening | ORAL | Status: DC | PRN

## 2023-03-05 MED ORDER — NITROGLYCERIN 0.4 MG SL SUBL
0.4000 mg | SUBLINGUAL_TABLET | Freq: Once | SUBLINGUAL | Status: AC
  Administered 2023-03-05: 0.4 mg via SUBLINGUAL
  Filled 2023-03-05: qty 1

## 2023-03-05 MED ORDER — FUROSEMIDE 10 MG/ML IJ SOLN
60.0000 mg | Freq: Once | INTRAMUSCULAR | Status: AC
  Administered 2023-03-05: 60 mg via INTRAVENOUS
  Filled 2023-03-05: qty 6

## 2023-03-05 MED ORDER — MAGNESIUM SULFATE 2 GM/50ML IV SOLN
2.0000 g | Freq: Once | INTRAVENOUS | Status: AC
  Administered 2023-03-05: 2 g via INTRAVENOUS

## 2023-03-05 MED ORDER — ENOXAPARIN SODIUM 40 MG/0.4ML IJ SOSY
40.0000 mg | PREFILLED_SYRINGE | INTRAMUSCULAR | Status: DC
  Administered 2023-03-05 – 2023-03-07 (×3): 40 mg via SUBCUTANEOUS
  Filled 2023-03-05 (×3): qty 0.4

## 2023-03-05 MED ORDER — POTASSIUM CHLORIDE 10 MEQ/100ML IV SOLN
10.0000 meq | Freq: Once | INTRAVENOUS | Status: AC
  Administered 2023-03-05: 10 meq via INTRAVENOUS
  Filled 2023-03-05: qty 100

## 2023-03-05 MED ORDER — NITROGLYCERIN IN D5W 200-5 MCG/ML-% IV SOLN
0.0000 ug/min | INTRAVENOUS | Status: DC
  Administered 2023-03-05: 5 ug/min via INTRAVENOUS
  Filled 2023-03-05: qty 250

## 2023-03-05 NOTE — ED Triage Notes (Addendum)
Pt arrived to triage complaining of chest pain that started this morning, states that she had been having congestion and a runny nose for several days prior.   Pain is on left side, and states she is feeling light headed and nauseous.   RA sats are 77% on arrival to triage, placed on 6L [place] to get to 89%  Pt noted to have swelling in feet   States only medical hx of of thyroid disease

## 2023-03-05 NOTE — Progress Notes (Signed)
  Echocardiogram 2D Echocardiogram has been performed.  Becky Jennings 03/05/2023, 2:01 PM

## 2023-03-05 NOTE — ED Provider Notes (Signed)
Emergency Department Provider Note   I have reviewed the triage vital signs and the nursing notes.   HISTORY  Chief Complaint Chest Pain   HPI Becky Jennings is a 47 y.o. female with PMH of hypothyroidism and cocaine/EtOH abuse presents to the ED with SOB and chest tightness. She reports symptoms ongoing/worsening over the 2-3 days. No fever. She is feeling "uncomfortable" in the center of her chest "like I can't breathe." No sharp pain. No vomiting or diarrhea. Denies IVDA. She does not smoke. She has not taken her synthroid in the last 6 months because it was making her hair fall out.    Past Medical History:  Diagnosis Date   No pertinent past medical history     Review of Systems  Constitutional: No fever/chills Cardiovascular: Positive chest pain. Respiratory: Positive shortness of breath. Gastrointestinal: No abdominal pain.  No nausea, no vomiting.   Musculoskeletal: Negative for back pain. Skin: Negative for rash. Neurological: Negative for headaches, focal weakness or numbness.  ____________________________________________   PHYSICAL EXAM:  VITAL SIGNS: ED Triage Vitals [03/05/23 0535]  Enc Vitals Group     BP (!) 174/103     Pulse Rate 97     Resp 20     Temp 97.7 F (36.5 C)     Temp Source Oral     SpO2 (!) 89 %   Constitutional: Alert and oriented. Appears unwell but able to provide a full history. Speaking in 2-3 word sentences.  Eyes: Conjunctivae are normal. Head: Atraumatic. Nose: No congestion/rhinnorhea. Mouth/Throat: Mucous membranes are moist.   Neck: No stridor.   Cardiovascular: Normal rate, regular rhythm. Good peripheral circulation. Grossly normal heart sounds.   Respiratory: Increased respiratory effort.  No retractions. Lungs with crackles bilaterally. No significant wheezing.  Gastrointestinal: Soft and nontender. No distention.  Musculoskeletal: No gross deformities of extremities. Trace pitting edema bilaterally.   Neurologic:  Normal speech and language.  Skin:  Skin is warm, dry and intact. No rash noted.  ____________________________________________   LABS (all labs ordered are listed, but only abnormal results are displayed)  Labs Reviewed  RESP PANEL BY RT-PCR (RSV, FLU A&B, COVID)  [id number]  COMPREHENSIVE METABOLIC PANEL  BRAIN NATRIURETIC PEPTIDE  LIPASE, BLOOD  CBC WITH DIFFERENTIAL/PLATELET  TSH  RAPID URINE DRUG SCREEN, HOSP PERFORMED  I-STAT BETA HCG BLOOD, ED ([facility], [facility], [facility] ONLY)  I-STAT VENOUS BLOOD GAS, ED  TROPONIN I (HIGH SENSITIVITY)   ____________________________________________  EKG   EKG Interpretation  Date/Time:  Friday March 05 2023 05:23:26 EDT Ventricular Rate:  89 PR Interval:  142 QRS Duration: 82 QT Interval:  388 QTC Calculation: 472 R Axis:   72 Text Interpretation: Normal sinus rhythm Possible Anterior infarct , age undetermined Abnormal ECG When compared with ECG of 23-Oct-2015 18:45, PREVIOUS ECG IS PRESENT Confirmed by Nanda Quinton [phone]) on 03/05/2023 5:42:15 AM        ____________________________________________  RADIOLOGY  No results found.  ____________________________________________   PROCEDURES  Procedure(s) performed:   Procedures  CRITICAL CARE Performed by: Margette Fast Total critical care time: 35 minutes Critical care time was exclusive of separately billable procedures and treating other patients. Critical care was necessary to treat or prevent imminent or life-threatening deterioration. Critical care was time spent personally by me on the following activities: development of treatment plan with patient and/or surrogate as well as nursing, discussions with consultants, evaluation of patient's response to treatment, examination of patient, obtaining history from patient or surrogate, ordering and  performing treatments and interventions, ordering and review of laboratory studies, ordering and review of radiographic studies,  pulse oximetry and re-evaluation of patient's condition.  Nanda Quinton, MD Emergency Medicine  ____________________________________________   INITIAL IMPRESSION / ASSESSMENT AND PLAN / ED COURSE  Pertinent labs & imaging results that were available during my care of the patient were reviewed by me and considered in my medical decision making (see chart for details).   This patient is Presenting for Evaluation of SOB/CP, which does require a range of treatment options, and is a complaint that involves a high risk of morbidity and mortality.  The Differential Diagnoses includes but is not exclusive to acute coronary syndrome, aortic dissection, pulmonary embolism, cardiac tamponade, community-acquired pneumonia, pericarditis, musculoskeletal chest wall pain, etc.   Critical Interventions-    Medications - No data to display  Reassessment after intervention:    I decided to review pertinent External Data, and in summary no similar presentation on ED record review in our system.   Clinical Laboratory Tests Ordered, included ***  Radiologic Tests Ordered, included ***. I independently interpreted the images and agree with radiology interpretation.   Cardiac Monitor Tracing which shows sinus tachycardia.    Social Determinants of Health Risk Positive EtOH and cocaine abuse.   Consult complete with  Medical Decision Making: Summary:  Patient presents emergency department with chest pain and shortness of breath.  Sounds like 2 to 3 days of symptoms with tightness in the chest.  No pleuritic pain but PE is a consideration.  She is hypoxemic on arrival requiring heated high flow.  Trace edema in the legs.  She is noncompliant with her thyroid medication and also abuses cocaine and alcohol.  New CHF is a consideration as well.  O2 sat responding well to supplemental oxygen.  Reevaluation with update and discussion with patient. CXR consistent with CHF. Transitioned to BiPAP and added low  dose ativan, nitro SL followed by infusion, and lasix.   ***Considered admission***  Patient's presentation is most consistent with acute presentation with potential threat to life or bodily function.   Disposition:   ____________________________________________  FINAL CLINICAL IMPRESSION(S) / ED DIAGNOSES  Final diagnoses:  None     NEW OUTPATIENT MEDICATIONS STARTED DURING THIS VISIT:  New Prescriptions   No medications on file    Note:  This document was prepared using Dragon voice recognition software and may include unintentional dictation errors.  Nanda Quinton, MD, [facility] Emergency Medicine

## 2023-03-05 NOTE — ED Notes (Signed)
Dr. Laverta Baltimore at bedside to reevaluate pt, pt continues to thrash around in bed, Bipap remains on pt, pt request for it to be removed, Dr. Laverta Baltimore talked about options if Bipap was to be removed, pt REFUSED INTUBATION at this time, Dr. Laverta Baltimore educated pt on importance of BI PAP, pt verbalized understanding.

## 2023-03-05 NOTE — ED Notes (Signed)
Pt urinated in bed, pt removed the purwick and diaper. Full bed linen change, new purwick applied and clean diaper applied. Pt educated of maintain diaper and purwick in place, pt verbalized understanding.

## 2023-03-05 NOTE — ED Notes (Signed)
Unable to get adequate BP, pt thrashing around in bed, will not keep arm still for automatic BP reading

## 2023-03-05 NOTE — Hospital Course (Addendum)
[name] is a 47 yo F with a PMH of hypothyroidism and cocaine/EtOH use who presented to the ED with SOB and chest tightness.    History is per EMR as patient is drowsy on exam and unable to provide any information.    Patient noted to have ongoing/worsening SOB and chest tightness over the past 2-3 days on admission. She stated that the chest discomfort was an uncomfortable sensation in the center of her chest and sensation of feeling like she cannot breathe. She denies sharp pain, vomiting, diarrhea, or IV drug use.   Patient reports she does not smoke and has not taken her synthroid in the last 6 months.    In the ED, she was noted to be hypertensive to 123456 systolics, hypoxemic requiring bipap, dilated pupils, TSH  8.64, negative troponin, elevated BNP (386), normocytic anemia 9.2, leukocytosis 15.7. CXR with flash pulmonary edema. EKG with no evidence of ST elevations or t wave inversions. She was started on nitroglycerin infusion, given IV lasix 60mg  x1, and given ativan for agitation. UDS was pending.

## 2023-03-05 NOTE — ED Provider Notes (Signed)
7:21 AM Patient re-evaluated. She is currently sleepy, but easily awakens.  Her respiratory rate is around 25-28.  Appears to be taking good tidal volumes.  Her VBG from less than an hour ago showed no CO2 retention.  Talking to the night nurse and from Dr. Laverta Baltimore, she was difficult to control and agitated earlier.  I suspect now she is more tired from a combination of receiving Ativan less than 1 hour ago as well as coming down off of what ever agitation/high she was on. Her blood pressure is better in the 170s.  Heart rate is also come down into the low 100s.  CBG is 200+.  Will continue to monitor closely and repeat an ABG around 8 AM. Currently protecting her airway.  8:14 AM Continues to be stable on multiple rechecks. Her HR is now normal, BP in 140s. She is still sleepy but easily awakens. Will consult medicine for admission.  8:30 AM I discussed with IM teaching service, who will admit.    Sherwood Gambler, MD 03/05/23 0830

## 2023-03-05 NOTE — ED Notes (Signed)
ED TO INPATIENT HANDOFF REPORT  ED Nurse Name and Phone #:  Su Grand [phone]  S Name/Age/Gender Becky Jennings 47 y.o. female Room/Bed: 024C/024C  Code Status   Code Status: Full Code  Home/SNF/Other Home Patient oriented to: self Is this baseline? No   Triage Complete: Triage complete  Chief Complaint Hypertensive emergency [I16.1]  Triage Note Pt arrived to triage complaining of chest pain that started this morning, states that she had been having congestion and a runny nose for several days prior.   Pain is on left side, and states she is feeling light headed and nauseous.   RA sats are 77% on arrival to triage, placed on 6L [place] to get to 89%  Pt noted to have swelling in feet   States only medical hx of of thyroid disease    Allergies No Known Allergies  Level of Care/Admitting Diagnosis ED Disposition     ED Disposition  Admit   Condition  --   [place]: [place] [[id number]]  Level of Care: Progressive [102]  Admit to Progressive based on following criteria: RESPIRATORY PROBLEMS hypoxemic/hypercapnic respiratory failure that is responsive to NIPPV (BiPAP) or High Flow Nasal Cannula (6-80 lpm). Frequent assessment/intervention, no > Q2 hrs < Q4 hrs, to maintain oxygenation and pulmonary hygiene.  May place patient in observation at [facility] or [place] if equivalent level of care is available:: Yes  Covid Evaluation: Confirmed COVID Negative  Diagnosis: Hypertensive emergency [id number]  Admitting Physician: Lucious Groves [[id number]]  Attending Physician: Lucious Groves [[id number]]          B Medical/Surgery History Past Medical History:  Diagnosis Date   No pertinent past medical history    Past Surgical History:  Procedure Laterality Date   CESAREAN SECTION       A IV Location/Drains/Wounds Patient Lines/Drains/Airways Status     Active Line/Drains/Airways     Name Placement date Placement time Site Days    Peripheral IV 03/05/23 20 G Left Antecubital 03/05/23  0559  Antecubital  less than 1   Peripheral IV 03/05/23 20 G Posterior;Right Hand 03/05/23  0653  Hand  less than 1            Intake/Output Last 24 hours  Intake/Output Summary (Last 24 hours) at 03/05/2023 0936 Last data filed at 03/05/2023 0905 Gross per 24 hour  Intake 11.6 ml  Output --  Net 11.6 ml    Labs/Imaging Results for orders placed or performed during the hospital encounter of 03/05/23 (from the past 48 hour(s))  Comprehensive metabolic panel     Status: Abnormal   Collection Time: 03/05/23  6:00 AM  Result Value Ref Range   Sodium 134 (L) 135 - 145 mmol/L   Potassium 3.8 3.5 - 5.1 mmol/L   Chloride 103 98 - 111 mmol/L   CO2 20 (L) 22 - 32 mmol/L   Glucose, Bld 129 (H) 70 - 99 mg/dL    Comment: Glucose reference range applies only to samples taken after fasting for at least 8 hours.   BUN 14 6 - 20 mg/dL   Creatinine, Ser 0.73 0.44 - 1.00 mg/dL   Calcium 8.0 (L) 8.9 - 10.3 mg/dL   Total Protein 6.9 6.5 - 8.1 g/dL   Albumin 2.5 (L) 3.5 - 5.0 g/dL   AST 45 (H) 15 - 41 U/L   ALT 37 0 - 44 U/L   Alkaline Phosphatase 107 38 - 126 U/L   Total  Bilirubin 0.7 0.3 - 1.2 mg/dL   GFR, Estimated >60 >60 mL/min    Comment: (NOTE) Calculated using the CKD-EPI Creatinine Equation (2021)    Anion gap 11 5 - 15    Comment: Performed at [facility] [address] [place], [place] [address]  Brain natriuretic peptide     Status: Abnormal   Collection Time: 03/05/23  6:00 AM  Result Value Ref Range   B Natriuretic Peptide 386.0 (H) 0.0 - 100.0 pg/mL    Comment: Performed at [facility] [address] [place], [place] [address]  Lipase, blood     Status: None   Collection Time: 03/05/23  6:00 AM  Result Value Ref Range   Lipase 38 11 - 51 U/L    Comment: Performed at [facility] [address], [place], [place] [address]  Troponin I (High Sensitivity)     Status: None   Collection Time:  03/05/23  6:00 AM  Result Value Ref Range   Troponin I (High Sensitivity) 7 <18 ng/L    Comment: (NOTE) Elevated high sensitivity troponin I (hsTnI) values and significant  changes across serial measurements may suggest ACS but many other  chronic and acute conditions are known to elevate hsTnI results.  Refer to the "Links" section for chest pain algorithms and additional  guidance. Performed at [facility], [place] [address] [place], [place] [address]   CBC with Differential     Status: Abnormal   Collection Time: 03/05/23  6:00 AM  Result Value Ref Range   WBC 15.7 (H) 4.0 - 10.5 K/uL   RBC 3.88 3.87 - 5.11 MIL/uL   Hemoglobin 9.2 (L) 12.0 - 15.0 g/dL   HCT 31.6 (L) 36.0 - 46.0 %   MCV 81.4 80.0 - 100.0 fL   MCH 23.7 (L) 26.0 - 34.0 pg   MCHC 29.1 (L) 30.0 - 36.0 g/dL   RDW 16.5 (H) 11.5 - 15.5 %   Platelets 406 (H) 150 - 400 K/uL   nRBC 0.0 0.0 - 0.2 %   Neutrophils Relative % 81 %   Neutro Abs 12.8 (H) 1.7 - 7.7 K/uL   Lymphocytes Relative 11 %   Lymphs Abs 1.7 0.7 - 4.0 K/uL   Monocytes Relative 6 %   Monocytes Absolute 0.9 0.1 - 1.0 K/uL   Eosinophils Relative 1 %   Eosinophils Absolute 0.1 0.0 - 0.5 K/uL   Basophils Relative 0 %   Basophils Absolute 0.1 0.0 - 0.1 K/uL   Immature Granulocytes 1 %   Abs Immature Granulocytes 0.10 (H) 0.00 - 0.07 K/uL    Comment: Performed at [facility] [address], [place], [place] [address]  Resp panel by RT-PCR (RSV, Flu A&B, Covid) Anterior Nasal Swab     Status: None   Collection Time: 03/05/23  6:00 AM   Specimen: Anterior Nasal Swab  Result Value Ref Range   SARS Coronavirus 2 by RT PCR NEGATIVE NEGATIVE   Influenza A by PCR NEGATIVE NEGATIVE   Influenza B by PCR NEGATIVE NEGATIVE    Comment: (NOTE) The Xpert Xpress SARS-CoV-2/FLU/RSV plus assay is intended as an aid in the diagnosis of influenza from Nasopharyngeal swab specimens and should not be used as a sole basis for treatment. Nasal washings  and aspirates are unacceptable for Xpert Xpress SARS-CoV-2/FLU/RSV testing.  Fact Sheet for Patients: EntrepreneurPulse.com.au  Fact Sheet for Healthcare Providers: IncredibleEmployment.be  This test is not yet approved or  cleared by the [place] and has been authorized for detection and/or diagnosis of SARS-CoV-2 by FDA under an Emergency Use Authorization (EUA). This EUA will remain in effect (meaning this test can be used) for the duration of the COVID-19 declaration under Section 564(b)(1) of the Act, 21 U.S.C. section 360bbb-3(b)(1), unless the authorization is terminated or revoked.     Resp Syncytial Virus by PCR NEGATIVE NEGATIVE    Comment: (NOTE) Fact Sheet for Patients: EntrepreneurPulse.com.au  Fact Sheet for Healthcare Providers: IncredibleEmployment.be  This test is not yet approved or cleared by the [place] FDA and has been authorized for detection and/or diagnosis of SARS-CoV-2 by FDA under an Emergency Use Authorization (EUA). This EUA will remain in effect (meaning this test can be used) for the duration of the COVID-19 declaration under Section 564(b)(1) of the Act, 21 U.S.C. section 360bbb-3(b)(1), unless the authorization is terminated or revoked.  Performed at [facility], [place] [address], [place], [place] [address]   TSH     Status: Abnormal   Collection Time: 03/05/23  6:00 AM  Result Value Ref Range   TSH 8.644 (H) 0.350 - 4.500 uIU/mL    Comment: Performed by a 3rd Generation assay with a functional sensitivity of <=0.01 uIU/mL. Performed at [facility], [place] [address], [place], [place] [address]   I-Stat beta hCG blood, ED     Status: None   Collection Time: 03/05/23  6:07 AM  Result Value Ref Range   I-stat hCG, quantitative <5.0 <5 mIU/mL   Comment 3            Comment:   GEST. AGE      CONC.  (mIU/mL)   <=1 WEEK        5 - 50     2 WEEKS        50 - 500     3 WEEKS       100 - 10,000     4 WEEKS     1,000 - 30,000        FEMALE AND NON-PREGNANT FEMALE:     LESS THAN 5 mIU/mL   I-Stat venous blood gas, ([facility] ED, [facility], [facility])     Status: Abnormal   Collection Time: 03/05/23  6:39 AM  Result Value Ref Range   pH, Ven 7.471 (H) 7.25 - 7.43   pCO2, Ven 35.4 (L) 44 - 60 mmHg   pO2, Ven 93 (H) 32 - 45 mmHg   Bicarbonate 25.9 20.0 - 28.0 mmol/L   TCO2 27 22 - 32 mmol/L   O2 Saturation 98 %   Acid-Base Excess 2.0 0.0 - 2.0 mmol/L   Sodium 135 135 - 145 mmol/L   Potassium 4.5 3.5 - 5.1 mmol/L   Calcium, Ion 1.00 (L) 1.15 - 1.40 mmol/L   HCT 30.0 (L) 36.0 - 46.0 %   Hemoglobin 10.2 (L) 12.0 - 15.0 g/dL   Sample type VENOUS   CBG monitoring, ED     Status: Abnormal   Collection Time: 03/05/23  7:20 AM  Result Value Ref Range   Glucose-Capillary 234 (H) 70 - 99 mg/dL    Comment: Glucose reference range applies only to samples taken after fasting for at least 8 hours.   Comment 1 Document in Chart   Troponin I (High Sensitivity)     Status: None   Collection Time: 03/05/23  7:55 AM  Result Value Ref Range   Troponin I (High Sensitivity) 9 <18  ng/L    Comment: (NOTE) Elevated high sensitivity troponin I (hsTnI) values and significant  changes across serial measurements may suggest ACS but many other  chronic and acute conditions are known to elevate hsTnI results.  Refer to the "Links" section for chest pain algorithms and additional  guidance. Performed at [facility], [place] [address] [place], [place] [address]   Ethanol     Status: None   Collection Time: 03/05/23  7:55 AM  Result Value Ref Range   Alcohol, Ethyl (B) <10 <10 mg/dL    Comment: (NOTE) Lowest detectable limit for serum alcohol is 10 mg/dL.  For medical purposes only. Performed at [facility], [place] [address], [place], [place] [address]   I-Stat arterial blood gas, ED     Status: Abnormal   Collection Time: 03/05/23  8:28 AM  Result Value Ref  Range   pH, Arterial 7.381 7.35 - 7.45   pCO2 arterial 48.6 (H) 32 - 48 mmHg   pO2, Arterial 132 (H) 83 - 108 mmHg   Bicarbonate 29.0 (H) 20.0 - 28.0 mmol/L   TCO2 30 22 - 32 mmol/L   O2 Saturation 99 %   Acid-Base Excess 3.0 (H) 0.0 - 2.0 mmol/L   Sodium 136 135 - 145 mmol/L   Potassium 3.2 (L) 3.5 - 5.1 mmol/L   Calcium, Ion 1.11 (L) 1.15 - 1.40 mmol/L   HCT 33.0 (L) 36.0 - 46.0 %   Hemoglobin 11.2 (L) 12.0 - 15.0 g/dL   Patient temperature 97.7 F    Sample type ARTERIAL    DG Chest Portable 1 View  Result Date: 03/05/2023 CLINICAL DATA:  Shortness of breath EXAM: PORTABLE CHEST 1 VIEW COMPARISON:  10/23/2015 FINDINGS: Normal heart size. Hazy bilateral opacification of the chest with Kerley lines. Small right pleural effusion is possible. No pneumothorax. Artifact from EKG leads. IMPRESSION: CHF pattern. Electronically Signed   By: Jorje Guild M.D.   On: 03/05/2023 06:06    Pending Labs Unresulted Labs (From admission, onward)     Start     Ordered   03/06/23 0500  CBC with Differential/Platelet  Tomorrow morning,   R        03/05/23 0852   03/06/23 0500  Comprehensive metabolic panel  Tomorrow morning,   R        03/05/23 0852   03/05/23 0853  Urinalysis, Routine w reflex microscopic -Urine, Random  Once,   R       Question:  Specimen Source  Answer:  Urine, Random   03/05/23 0852   03/05/23 0853  CK  Once,   R        03/05/23 0852   03/05/23 0845  Magnesium  Once,   R        03/05/23 0845   03/05/23 0845  Phosphorus  Once,   R        03/05/23 0845   03/05/23 0845  T4, free  Once,   R        03/05/23 0845   03/05/23 0842  HIV Antibody (routine testing w rflx)  (HIV Antibody (Routine testing w reflex) panel)  Once,   R        03/05/23 0845   03/05/23 0548  Urine rapid drug screen (hosp performed)  Once,   STAT        03/05/23 0547            Vitals/Pain Today's Vitals   03/05/23 0845 03/05/23 0900  03/05/23 0915 03/05/23 0930  BP: (!) 134/93 (!) 164/109 (!)  177/108 (!) 174/108  Pulse: 80 92 99 88  Resp:      Temp:      TempSrc:      SpO2: 100% 90% 94% (!) 87%  PainSc:        Isolation Precautions Airborne and Contact precautions  Medications Medications  nitroGLYCERIN 50 mg in dextrose 5 % 250 mL (0.2 mg/mL) infusion (20 mcg/min Intravenous Restarted 03/05/23 0909)  enoxaparin (LOVENOX) injection 40 mg (40 mg Subcutaneous Given 03/05/23 0911)  acetaminophen (TYLENOL) tablet 650 mg (has no administration in time range)    Or  acetaminophen (TYLENOL) suppository 650 mg (has no administration in time range)  senna-docusate (Senokot-S) tablet 1 tablet (has no administration in time range)  nitroGLYCERIN (NITROSTAT) SL tablet 0.4 mg (0.4 mg Sublingual Given 03/05/23 0608)  LORazepam (ATIVAN) injection 1 mg (1 mg Intravenous Given 03/05/23 0619)  furosemide (LASIX) injection 60 mg (60 mg Intravenous Given 03/05/23 0621)  nitroGLYCERIN (NITROSTAT) SL tablet 0.4 mg (0.4 mg Sublingual Given 03/05/23 0622)    Mobility walks     Focused Assessments Cardiac Assessment Handoff:  Cardiac Rhythm: Sinus tachycardia No results found for: "CKTOTAL", "CKMB", "CKMBINDEX", "TROPONINI" No results found for: "DDIMER" Does the Patient currently have chest pain? No    R Recommendations: See Admitting Provider Note  Report given to:   Additional Notes:  pt responds to verbal command, easily aroused

## 2023-03-05 NOTE — H&P (Signed)
Date: 03/05/2023               Patient Name:  Becky Jennings MRN: [id number]  DOB: 12/07/1976 Age / Sex: 47 y.o., female   PCP: Pcp, No         Medical Service: Internal Medicine Teaching Service         Attending Physician: Dr. Lucious Groves, DO    First Contact: Dr. Angelique Blonder  Pager: [phone]  Second Contact: Dr. Christiana Fuchs  Pager: [phone]       After Hours (After 5p/  First Contact Pager: [phone]  weekends / holidays): Second Contact Pager: [phone]   Chief Complaint: chest pain  History of Present Illness:   [name] is a 47 yo F with a PMH of hypothyroidism and cocaine/EtOH use who presented to the ED with SOB and chest tightness.    History is per EMR as patient is drowsy on exam and unable to provide any information.    Patient noted to have ongoing/worsening SOB and chest tightness over the past 2-3 days on admission. She stated that the chest discomfort was an uncomfortable sensation in the center of her chest and sensation of feeling like she cannot breathe. She denies sharp pain, vomiting, diarrhea, or IV drug use.   Patient reports she does not smoke and has not taken her synthroid in the last 6 months.   In the ED, she was noted to be hypertensive to 123456 systolics, hypoxemic to [id number], requiring bipap, dilated pupils, TSH  8.64, negative troponin, elevated BNP (386), normocytic anemia 9.2, leukocytosis 15.7. CXR with flash pulmonary edema. EKG with no evidence of ST elevations or t wave inversions. She was started on nitroglycerin infusion, given IV lasix 60mg  x1, and given ativan for agitation. UDS was pending.   Meds:  Unable to assess  Allergies: Unable to assess due to patients mental status.    Past Medical History:  Diagnosis Date   No pertinent past medical history   Unable to assess   Family History: Unable to assess   Social History: Unable to assess   Review of Systems: A complete ROS was negative except as per HPI.    Physical Exam: Blood pressure (!) 163/94, pulse 80, temperature (!) 97 F (36.1 C), temperature source Axillary, resp. rate (!) 26, SpO2 99 %, unknown if currently breastfeeding.   Constitutional: Drowsy and in no distress.  Eyes: dilated pupils  Cardiovascular: Normal rate, regular rhythm, intact distal pulses. No gallop and no friction rub.  No murmur heard. No lower extremity edema  Pulmonary: Non labored breathing on bipap, no wheezing or rales  Abdominal: Soft. Normal bowel sounds. Non distended and non tender Musculoskeletal: Normal range of motion.        General: No tenderness or edema.  Neurological: Drowsy, opens eyes to voice, able to state name and where she is located.  Skin: Skin is warm and dry.    EKG: personally reviewed my interpretation is no ST or T wave changes, NSR  CXR: personally reviewed my interpretation is hazy bilateral opacifications concerning for pulmonary edema   Assessment & Plan by Problem: Principal Problem:   Hypertensive emergency [name] is a 47 yo F with a reported PMH of hypothyroidism and cocaine/EtOH who presented with chest tightness found to have severe hypertension (123456 systolic) and flash pulmonary edema requiring BIPAP.   #Severe symptomatic hypertension #Flash pulmonary edema  #Chest tightness  Likely in the setting of  cocaine use. Patient has no history of hypertension. She has a reported history of cocaine use. UDS was pending as of this note. She presented with SBP in the 200s and flash pulmonary edema (Hypoxic 77%). She was started on nitrogylcerin infusion, given IV ativan, and BipAP.Low concern for ACS given negative troponins and no EKG findings concerning for ACS.  -Wean nitroglycerin drip for SBP [id number] systolic  -s/p IV lasix 60mg  x1, redose this in the PM -NPO until more awake -Recheck VBG @ 1400, switch bipap to heated high flow [place]  -TOC for cocaine use    #Hypothyroidism  TSH 8.64.  -F/u FT4 -Resume  levothyroxine when able to take PO  #Cocaine use Consult TOC   Dispo: Admit patient to Inpatient with expected length of stay greater than 2 midnights.  Signed: Rick Duff, MD 03/05/2023, 11:55 AM  After 5pm on weekdays and 1pm on weekends: On Call pager: [phone]

## 2023-03-06 DIAGNOSIS — F1410 Cocaine abuse, uncomplicated: Secondary | ICD-10-CM | POA: Diagnosis present

## 2023-03-06 DIAGNOSIS — I161 Hypertensive emergency: Secondary | ICD-10-CM | POA: Diagnosis present

## 2023-03-06 DIAGNOSIS — J9601 Acute respiratory failure with hypoxia: Secondary | ICD-10-CM | POA: Diagnosis not present

## 2023-03-06 DIAGNOSIS — F329 Major depressive disorder, single episode, unspecified: Secondary | ICD-10-CM | POA: Diagnosis present

## 2023-03-06 DIAGNOSIS — E038 Other specified hypothyroidism: Secondary | ICD-10-CM | POA: Diagnosis present

## 2023-03-06 DIAGNOSIS — F419 Anxiety disorder, unspecified: Secondary | ICD-10-CM | POA: Diagnosis present

## 2023-03-06 DIAGNOSIS — I110 Hypertensive heart disease with heart failure: Secondary | ICD-10-CM | POA: Diagnosis present

## 2023-03-06 DIAGNOSIS — J810 Acute pulmonary edema: Secondary | ICD-10-CM | POA: Diagnosis present

## 2023-03-06 DIAGNOSIS — I5021 Acute systolic (congestive) heart failure: Secondary | ICD-10-CM | POA: Diagnosis present

## 2023-03-06 DIAGNOSIS — D72829 Elevated white blood cell count, unspecified: Secondary | ICD-10-CM | POA: Diagnosis present

## 2023-03-06 DIAGNOSIS — Z91148 Patient's other noncompliance with medication regimen for other reason: Secondary | ICD-10-CM | POA: Diagnosis not present

## 2023-03-06 DIAGNOSIS — Z1152 Encounter for screening for COVID-19: Secondary | ICD-10-CM | POA: Diagnosis not present

## 2023-03-06 DIAGNOSIS — J811 Chronic pulmonary edema: Secondary | ICD-10-CM | POA: Diagnosis not present

## 2023-03-06 DIAGNOSIS — D75839 Thrombocytosis, unspecified: Secondary | ICD-10-CM | POA: Diagnosis present

## 2023-03-06 DIAGNOSIS — D509 Iron deficiency anemia, unspecified: Secondary | ICD-10-CM | POA: Diagnosis present

## 2023-03-06 DIAGNOSIS — T405X1A Poisoning by cocaine, accidental (unintentional), initial encounter: Secondary | ICD-10-CM | POA: Diagnosis present

## 2023-03-06 LAB — CBC WITH DIFFERENTIAL/PLATELET
Abs Immature Granulocytes: 0.07 10*3/uL (ref 0.00–0.07)
Basophils Absolute: 0 10*3/uL (ref 0.0–0.1)
Basophils Relative: 0 %
Eosinophils Absolute: 0 10*3/uL (ref 0.0–0.5)
Eosinophils Relative: 0 %
HCT: 31 % — ABNORMAL LOW (ref 36.0–46.0)
Hemoglobin: 9.3 g/dL — ABNORMAL LOW (ref 12.0–15.0)
Immature Granulocytes: 0 %
Lymphocytes Relative: 12 %
Lymphs Abs: 2 10*3/uL (ref 0.7–4.0)
MCH: 23.9 pg — ABNORMAL LOW (ref 26.0–34.0)
MCHC: 30 g/dL (ref 30.0–36.0)
MCV: 79.7 fL — ABNORMAL LOW (ref 80.0–100.0)
Monocytes Absolute: 1.3 10*3/uL — ABNORMAL HIGH (ref 0.1–1.0)
Monocytes Relative: 8 %
Neutro Abs: 12.4 10*3/uL — ABNORMAL HIGH (ref 1.7–7.7)
Neutrophils Relative %: 80 %
Platelets: 424 10*3/uL — ABNORMAL HIGH (ref 150–400)
RBC: 3.89 MIL/uL (ref 3.87–5.11)
RDW: 16.4 % — ABNORMAL HIGH (ref 11.5–15.5)
WBC: 15.8 10*3/uL — ABNORMAL HIGH (ref 4.0–10.5)
nRBC: 0 % (ref 0.0–0.2)

## 2023-03-06 LAB — MAGNESIUM: Magnesium: 2.3 mg/dL (ref 1.7–2.4)

## 2023-03-06 LAB — COMPREHENSIVE METABOLIC PANEL
ALT: 25 U/L (ref 0–44)
AST: 23 U/L (ref 15–41)
Albumin: 2.3 g/dL — ABNORMAL LOW (ref 3.5–5.0)
Alkaline Phosphatase: 92 U/L (ref 38–126)
Anion gap: 15 (ref 5–15)
BUN: 13 mg/dL (ref 6–20)
CO2: 23 mmol/L (ref 22–32)
Calcium: 7.9 mg/dL — ABNORMAL LOW (ref 8.9–10.3)
Chloride: 100 mmol/L (ref 98–111)
Creatinine, Ser: 0.82 mg/dL (ref 0.44–1.00)
GFR, Estimated: >60 mL/min (ref >60)
Glucose, Bld: 88 mg/dL (ref 70–99)
Potassium: 3.6 mmol/L (ref 3.5–5.1)
Sodium: 138 mmol/L (ref 135–145)
Total Bilirubin: 0.7 mg/dL (ref 0.3–1.2)
Total Protein: 6.6 g/dL (ref 6.5–8.1)

## 2023-03-06 MED ORDER — POTASSIUM CHLORIDE 10 MEQ/100ML IV SOLN
10.0000 meq | INTRAVENOUS | Status: AC
  Administered 2023-03-06 (×4): 10 meq via INTRAVENOUS
  Filled 2023-03-06 (×4): qty 100

## 2023-03-06 MED ORDER — CITALOPRAM HYDROBROMIDE 20 MG PO TABS
20.0000 mg | ORAL_TABLET | Freq: Every day | ORAL | Status: DC
  Administered 2023-03-06 – 2023-03-07 (×2): 20 mg via ORAL
  Filled 2023-03-06 (×2): qty 1

## 2023-03-06 MED ORDER — LEVOTHYROXINE SODIUM 25 MCG PO TABS
25.0000 ug | ORAL_TABLET | Freq: Every day | ORAL | Status: DC
  Administered 2023-03-07: 25 ug via ORAL
  Filled 2023-03-06: qty 1

## 2023-03-06 MED ORDER — LOSARTAN POTASSIUM 25 MG PO TABS
25.0000 mg | ORAL_TABLET | Freq: Every day | ORAL | Status: DC
  Administered 2023-03-06: 25 mg via ORAL
  Filled 2023-03-06: qty 1

## 2023-03-06 MED ORDER — FUROSEMIDE 10 MG/ML IJ SOLN
60.0000 mg | Freq: Once | INTRAMUSCULAR | Status: AC
  Administered 2023-03-06: 60 mg via INTRAVENOUS
  Filled 2023-03-06: qty 6

## 2023-03-06 NOTE — Progress Notes (Signed)
HD#0 Subjective:  Overnight Events: transitioned form Bipap to HFNC. Somnolent overnight, ABG within normal limits.  Ms. Bartholomew is feeling better than yesterday with no further chest tightness. She continues to feel some shortness of breath that is improved from yesterday. She also has noted some swelling in her legs. We talked about what made her come the the hospital with concern that ongoing cocaine use could be related to presentation. She is interested in stopping use of cocaine but reports that she has anxiety/ depression that contribute to use and is interested in restarting therapy for this. Previously took wellbutrin.  Pt is updated on the plan for today, and all questions and concerns are addressed.   Objective:  Vital signs in last 24 hours: Vitals:   03/06/23 0018 03/06/23 0330 03/06/23 0400 03/06/23 0415  BP: (!) 149/88 (!) 144/80 139/74 (!) 167/84  Pulse: (!) 103 83 71 77  Resp: (!) 23 (!) 25 (!) 25 (!) 23  Temp: 98.1 F (36.7 C)   98.6 F (37 C)  TempSrc: Oral   Axillary  SpO2: 100% 99% 98% 90%  Weight:      Height:       Supplemental O2: HFNC SpO2: 90 % O2 Flow Rate (L/min): 3 L/min FiO2 (%): (S) 40 %   Physical Exam:  Constitutional: well-appearing, sitting up in bed able to move independently Cardiovascular: regular rate and rhythm, no m/r/g Pulmonary/Chest: normal work of breathing on 3L [place], crackles present to lower lung fields bilaterally MSK: trace edema to lower extremities Neurological: alert and oriented to person, place and time Skin: warm and dry  Filed Weights   03/05/23 1220  Weight: 63 kg     Intake/Output Summary (Last 24 hours) at 03/06/2023 1134 Last data filed at 03/06/2023 1005 Gross per 24 hour  Intake 254.16 ml  Output 850 ml  Net -595.84 ml   Net IO Since Admission: -584.24 mL [03/06/23 1134]  Pertinent Labs: WBC 15.8 Hgb 9.3 with baseline 10-11 Platelets 424  BNP 386  TSH 8 T4 0.7  Imaging: Echo showed EF 45-50%  with mildly decreased function of left ventricle and global hypokinesis.  Assessment/Plan:   Principal Problem:   Hypertensive emergency Active Problems:   Acute respiratory failure with hypoxia (HCC)   Cocaine abuse ([place])   Acute pulmonary edema ([place])   Patient Summary: Becky Jennings is a 47 y.o. with a pertinent PMH of hypothyroidism and cocaine/ EtOH use, who presented with SOB and chest tightness and admitted on 3/15 for severe symptomatic hypertension complicated by flash pulmonary edema on HD#1.   Acute hypoxic respiratory failure Severe symptomatic hypertension-improved Flash pulmonary edema  Patient presents with flash pulmonary edema and symptomatic hypertension. She was started on nitro drip and systolic blood pressure improved today to 140-150s. She was also given IV Lasix 60 mg BID 3/15 with urine output today charted as -850. Will give additional diuresis today and complete ambulatory saturations. 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 Leading differential is 2/2 to cocaine use but cannot rule out ischemic cause. On exam she does have crackles in lower lung fields and well as trace lower extremity edema -initiate GDMT with losartan 25 mg, will consider MRA and SGLT2 as outpatient -IV lasix 60 mg once -LDL panel tomorrow to evaluate ASCVD risk -plan to repeat echo in June as outpatient, she will need cardiology referral if EF  doesn't recover  Anxiety MDD Long-standing history of anxiety and depression. She reports taking wellbutrin in the past with improvement. On chart review she has also taken citalopram and paroxetine. She does feel that she uses cocaine to help with symptoms and is interested in restarting medications. I do not think wellbutrin is the best medication for her with history of anxiety. -Start citalopram 20 mg qd   Cocaine use Leukocytosis From  chart review she has used since 2005. WBC elevated at 15.2 in setting of use on 3/14. -TOC for substance use counseling  Subclinical Hypothyroidism TSH at 8 with free T4 of 0.7. She does not take medications for this but has taken synthroid in the past. -restart synthroid 25 mg 3/17 -f/u with [facility] as outpatient  Diet: Normal VTE: Enoxaparin Code: Full PT/OT recs: none TOC recs: will follow-up with Internal Medicine Clinic at discharge. Message sent to front desk to arrange outpatient follow-up Family Update:   Dispo: Anticipated discharge to Home in 1 days pending improvement in respiratory failure.   Laquentin Loudermilk M. Madalena Kesecker, D.O.  Internal Medicine Resident, PGY-2 [facility]  Pager: [phone] 11:34 AM, 03/06/2023   **Please contact the on call pager after 5 pm and on weekends at [phone].**

## 2023-03-06 NOTE — Progress Notes (Signed)
Nurse requested Mobility Specialist to perform oxygen saturation test with pt which includes removing pt from oxygen both at rest and while ambulating.  Below are the results from that testing.     Patient Saturations on Room Air at Rest = spO2 98%  Patient Saturations on Room Air while Ambulating = sp02 95%  Patient Saturations on N/A Liters of oxygen while Ambulating = sp02 N/A%  At end of testing pt left in room on N/A  Liters of oxygen.  Reported results to nurse.  Nelta Numbers Mobility Specialist Please contact via SecureChat or  Rehab office at [phone]

## 2023-03-06 NOTE — Progress Notes (Signed)
Mobility Specialist Progress Note:   03/06/23 1345  Mobility  Activity Ambulated with assistance in hallway  Level of Assistance Modified independent, requires aide device or extra time  Assistive Device None  Distance Ambulated (ft) 175 ft  Activity Response Tolerated well  Mobility Referral Yes  $Mobility charge 1 Mobility   Pt agreeable to mobility session. No supplemental O2 needed. Pt back in bed with all needs met.  Nelta Numbers Mobility Specialist Please contact via SecureChat or  Rehab office at [phone]

## 2023-03-07 DIAGNOSIS — I161 Hypertensive emergency: Secondary | ICD-10-CM

## 2023-03-07 LAB — IRON AND TIBC
Iron: 11 ug/dL — ABNORMAL LOW (ref 28–170)
Saturation Ratios: 3 % — ABNORMAL LOW (ref 10.4–31.8)
TIBC: 354 ug/dL (ref 250–450)
UIBC: 343 ug/dL

## 2023-03-07 LAB — BASIC METABOLIC PANEL
Anion gap: 6 (ref 5–15)
BUN: 20 mg/dL (ref 6–20)
CO2: 27 mmol/L (ref 22–32)
Calcium: 8.8 mg/dL — ABNORMAL LOW (ref 8.9–10.3)
Chloride: 104 mmol/L (ref 98–111)
Creatinine, Ser: 0.69 mg/dL (ref 0.44–1.00)
GFR, Estimated: >60 mL/min (ref >60)
Glucose, Bld: 124 mg/dL — ABNORMAL HIGH (ref 70–99)
Potassium: 4.1 mmol/L (ref 3.5–5.1)
Sodium: 137 mmol/L (ref 135–145)

## 2023-03-07 LAB — CBC
HCT: 29.9 % — ABNORMAL LOW (ref 36.0–46.0)
Hemoglobin: 9.3 g/dL — ABNORMAL LOW (ref 12.0–15.0)
MCH: 24.5 pg — ABNORMAL LOW (ref 26.0–34.0)
MCHC: 31.1 g/dL (ref 30.0–36.0)
MCV: 78.9 fL — ABNORMAL LOW (ref 80.0–100.0)
Platelets: 494 10*3/uL — ABNORMAL HIGH (ref 150–400)
RBC: 3.79 MIL/uL — ABNORMAL LOW (ref 3.87–5.11)
RDW: 16.6 % — ABNORMAL HIGH (ref 11.5–15.5)
WBC: 10.3 10*3/uL (ref 4.0–10.5)
nRBC: 0 % (ref 0.0–0.2)

## 2023-03-07 LAB — LIPID PANEL
Cholesterol: 175 mg/dL (ref 0–200)
HDL: 50 mg/dL (ref >40)
LDL Cholesterol: 103 mg/dL — ABNORMAL HIGH (ref 0–99)
Total CHOL/HDL Ratio: 3.5 RATIO
Triglycerides: 110 mg/dL (ref <150)
VLDL: 22 mg/dL (ref 0–40)

## 2023-03-07 LAB — FERRITIN: Ferritin: 18 ng/mL (ref 11–307)

## 2023-03-07 MED ORDER — LOSARTAN POTASSIUM 50 MG PO TABS
50.0000 mg | ORAL_TABLET | Freq: Every day | ORAL | Status: DC
  Administered 2023-03-07: 50 mg via ORAL
  Filled 2023-03-07: qty 1

## 2023-03-07 MED ORDER — LEVOTHYROXINE SODIUM 25 MCG PO TABS: 25.0000 ug | ORAL_TABLET | Freq: Every day | ORAL | 3 refills | Status: AC

## 2023-03-07 MED ORDER — FERROUS SULFATE 325 (65 FE) MG PO TABS
325.0000 mg | ORAL_TABLET | ORAL | Status: DC
  Administered 2023-03-07: 325 mg via ORAL
  Filled 2023-03-07: qty 1

## 2023-03-07 MED ORDER — FERROUS SULFATE 325 (65 FE) MG PO TABS: 325.0000 mg | ORAL_TABLET | ORAL | 3 refills | Status: AC

## 2023-03-07 MED ORDER — SPIRONOLACTONE 25 MG PO TABS: 12.5000 mg | ORAL_TABLET | Freq: Every day | ORAL | 3 refills | Status: AC

## 2023-03-07 MED ORDER — AMOXICILLIN-POT CLAVULANATE 875-125 MG PO TABS: 1.0000 | ORAL_TABLET | Freq: Two times a day (BID) | ORAL | 0 refills | Status: AC

## 2023-03-07 MED ORDER — LOSARTAN POTASSIUM 50 MG PO TABS: 50.0000 mg | ORAL_TABLET | Freq: Every day | ORAL | 3 refills | Status: AC

## 2023-03-07 MED ORDER — SPIRONOLACTONE 12.5 MG HALF TABLET
12.5000 mg | ORAL_TABLET | Freq: Every day | ORAL | Status: DC
  Administered 2023-03-07: 12.5 mg via ORAL
  Filled 2023-03-07: qty 1

## 2023-03-07 MED ORDER — CITALOPRAM HYDROBROMIDE 20 MG PO TABS: 20.0000 mg | ORAL_TABLET | Freq: Every day | ORAL | 3 refills | Status: AC

## 2023-03-07 NOTE — Discharge Instructions (Addendum)
[name], Becky Jennings were recently admitted to [facility] for fluid in your lungs from high blood pressure. The high blood pressure was caused by cocaine use.  We also did an ultrasound of your heart that showed it is is not squeezing normally.  I have started several medications to help with your heart, but it is most important that you stop using cocaine.  Plan is to recheck the ultrasound of your heart in about 3 months to give it time to recover. With heart failure it is important that you seek care if you have shortness of breath, swelling in your legs, or gain 2lb in a day. Please try to get a scale at home if able.  Continue taking your home medications with the following changes:  Start taking- 1.  Losartan 50 mg daily 2. Spironolactone 12.5 mg daily 3.  Citalopram 20 mg daily 4.  Levothyroxine 25 mg daily 5. Iron supplement every other day 6. Augmentin 2 times daily for 5 days  I want to make sure you have good follow-up with the primary care physician.  We have a clinic that is on the ground floor of the hospital called the [facility] internal medicine clinic.  Have sent a message to the front desk for them to call you to help with setting up an appointment.  Address: [facility]  [address] [place], [place] [address]                # [phone]  I hope that you are feeling better,  Best, Christiana Fuchs, DO

## 2023-03-07 NOTE — Discharge Summary (Addendum)
Name: Becky Jennings MRN: [id number] DOB: Jul 14, 1976 47 y.o. PCP: Pcp, No  Date of Admission: 03/05/2023  5:25 AM Date of Discharge: 03/07/23 Attending Physician: Dr. Saverio Danker  Discharge Diagnosis: Principal Problem:   Hypertensive emergency Active Problems:   Acute respiratory failure with hypoxia (HCC)   Cocaine abuse ([place])   Acute pulmonary edema ([place])    Discharge Medications: Allergies as of 03/07/2023   No Known Allergies      Medication List     TAKE these medications    amoxicillin-clavulanate 875-125 MG tablet Commonly known as: AUGMENTIN Take 1 tablet by mouth 2 (two) times daily for 5 days.   citalopram 20 MG tablet Commonly known as: CELEXA Take 1 tablet (20 mg total) by mouth daily.   ferrous sulfate 325 (65 FE) MG tablet Take 1 tablet (325 mg total) by mouth every other day. Start taking on: March 09, 2023   levothyroxine 25 MCG tablet Commonly known as: SYNTHROID Take 1 tablet (25 mcg total) by mouth daily at 6 (six) AM.   losartan 50 MG tablet Commonly known as: COZAAR Take 1 tablet (50 mg total) by mouth daily.   spironolactone 25 MG tablet Commonly known as: ALDACTONE Take 0.5 tablets (12.5 mg total) by mouth daily.        Disposition and follow-up:   [name] was discharged from [facility] in Stable condition.  At the hospital follow up visit please address:  1.  Follow-up: a. HTN emergency- in setting of cocaine use.  She uses to help with anxiety and depression.  It is very important to get her plugged in with behavioral health if she is open to this idea.   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  She was started on losartan and spironolactone.  Consider starting SGLT2 if affordable for insurance.  I did not start beta-blocker in setting of cocaine use.  Needs repeat echo in June and discussion of referral to cardiology for further  evaluation.  c.  Subclinical hypothyroidism-started Synthroid 25 mg  d. Depression-is open to starting citalopram 20 mg.  Has taken Wellbutrin and paroxetine in the past.  e. Iron Deficiency anemia, never had colonoscopy in past. Discuss referral + other etiologies.  Labs needed at f/u: CBC, thrombocytosis  Hospital Course by problem list: Acute hypoxic respiratory failure Severe symptomatic hypertension Flash pulmonary edema  Patient presented with acute hypoxic respiratory failure in setting of flash pulmonary edema from hypertensive emergency secondary to cocaine use.  Initially was hypertensive with systolics at 123456 and hypoxemic to 77 placed on BiPAP.  Initial lab evaluation showed BNP elevated at 386 chest x-ray with flash pulmonary edema.  Troponin were within normal limits and no ischemic changes on EKG.  UDS positive for cocaine, patient reported last use on March 14.  She was started on IV Lasix.  Urination improved and on day prior to discharge she did not require supplemental oxygen to maintain saturations with ambulation.   New HFmrEF In setting of hypertensive emergency echo was completed.  This showed EF of 45 to 50% with global hypokinesis of left ventricle.  Most likely cause of this is secondary to cocaine use.  Will need repeat echo in June.  If is not improved she may need ischemic evaluation. Lipid panel completed with ASCVD risk at 1 to 3% percent.  Started on losartan and spironolactone at discharge.  Could consider adding [place].  Beta-blocker contraindicated in  setting of cocaine use.   Anxiety MDD Long-standing history of anxiety and depression. She reports taking wellbutrin in the past with improvement. On chart review she has also taken citalopram and paroxetine. She does feel that she uses cocaine to help with symptoms and is interested in restarting medications. I do not think wellbutrin is the best medication for her with history of anxiety.  Citalopram 20 mg  started.  She is interested in behavioral health counseling.   Cocaine use Leukocytosis Thrombocytosis From chart review she has used since 2005. WBC elevated at 15.2 in setting of use on 3/14.   Subclinical Hypothyroidism TSH at 8 with free T4 of 0.7. She does not take medications for this but has taken synthroid in the past.  Synthroid restarted at discharge.  Iron deficiency anemia Iron 11, saturation ratios 3%, ferritin 18.  Ferrous sulfate 325 mg started every other day at discharge  Bacterial sinusitis 7 days history of sinus congestion that is worsening and subjective chills. She remained afebrile. No productive cough. She had been taking dayquil in the days leading to admission without improvement in symptoms.  She was discharged with Augmentin 875 twice daily for 5 days.  Discharge Subjective: Patient evaluated at bedside this AM.  She reports no further episodes of chest discomfort or shortness of breath.  She was able to get up and out of bed and move comfortably yesterday and this morning.  While she is feeling much better.  We talked about need to stop using cocaine so that this would not happen again, she has been using for at least 20 years and uses to help with anxiety and depression.   For the last 7 days she is also noted sinus congestion and subjective chills.  Tried DayQuil without improvement in her symptoms.    Discharge Exam:   BP (!) 168/95 (BP Location: Right Arm)   Pulse 79   Temp 98.3 F (36.8 C) (Axillary)   Resp 18   Ht 5' (1.524 m)   Wt 63 kg   LMP  (LMP Unknown)   SpO2 92%   BMI 27.13 kg/m  Constitutional: well-appearing  Cardiovascular: regular rate and rhythm, no m/r/g Pulmonary/Chest: normal work of breathing on room air, lungs clear to auscultation bilaterally Abdominal: soft, non-tender, non-distended MSK: no lower extremity edema bilaterally Skin: warm and dry Psych: normal affect   Pertinent Labs, Studies, and Procedures:     Latest  Ref Rng & Units 03/07/2023    2:20 AM 03/06/2023    1:00 AM 03/05/2023   12:21 PM  CBC  WBC 4.0 - 10.5 K/uL 10.3  15.8    Hemoglobin 12.0 - 15.0 g/dL 9.3  9.3  10.2   Hematocrit 36.0 - 46.0 % 29.9  31.0  30.0   Platelets 150 - 400 K/uL 494  424         Latest Ref Rng & Units 03/07/2023    2:20 AM 03/06/2023    1:00 AM 03/05/2023   12:21 PM  CMP  Glucose 70 - 99 mg/dL 124  88    BUN 6 - 20 mg/dL 20  13    Creatinine 0.44 - 1.00 mg/dL 0.69  0.82    Sodium 135 - 145 mmol/L 137  138  137   Potassium 3.5 - 5.1 mmol/L 4.1  3.6  3.7   Chloride 98 - 111 mmol/L 104  100    CO2 22 - 32 mmol/L 27  23    Calcium 8.9 -  10.3 mg/dL 8.8  7.9    Total Protein 6.5 - 8.1 g/dL  6.6    Total Bilirubin 0.3 - 1.2 mg/dL  0.7    Alkaline Phos 38 - 126 U/L  92    AST 15 - 41 U/L  23    ALT 0 - 44 U/L  25      ECHOCARDIOGRAM COMPLETE EF 45 to 50%.  Left ventricle with mildly decreased function left ventricle with global hypokinesis. DG Chest Portable 1 View  CXR FINDINGS: Normal heart size. Hazy bilateral opacification of the chest with Kerley lines. Small right pleural effusion is possible. No pneumothorax. Artifact from EKG leads. IMPRESSION: CHF pattern.     Discharge Instructions: Discharge Instructions     Diet - low sodium heart healthy   Complete by: As directed    Discharge instructions   Complete by: As directed    Ms. Rensch,  You were recently admitted to [facility] for fluid in your lungs from high blood pressure. The high blood pressure was caused by cocaine use.  We also did an ultrasound of your heart that showed it is squeezing last the normal.  I have started his medications to help with your heart, it is most important that you stop using cocaine.  Plan is to recheck the ultrasound of your heart in about 3 months to give it time to recover. With heart failure it is important that you seek care if you have shortness of breath, swelling in your legs, or gain 2lb in a day.  Please try to get a scale at home if able.  Continue taking your home medications with the following changes:  Start taking- 1.  Losartan 50 mg daily 2. Spironolactone 12.5 mg daily 3.  Citalopram 20 mg daily 4.  Levothyroxine 25 mg daily 5. Iron supplement every other day 6. Augmentin 2 times daily for 5 days   Want to make sure you have good follow-up with the primary care physician.  We have a clinic that is in the basement of the hospital called the [facility] internal medicine clinic.  Have sent a message to the front desk for them to call you to help with setting up an appointment.  Address: [facility]  [address], [place], [place] [address]                # [phone]  I hope that you are feeling better,  Best, Joellen Jersey Blanchard [place], DO   Increase activity slowly   Complete by: As directed        Signed: Christiana Fuchs, DO 03/07/2023, 11:36 AM   Pager: [phone]

## 2023-03-07 NOTE — TOC Transition Note (Signed)
Transition of Care [facility]) - CM/SW Discharge Note   Patient Details  Name: Becky Jennings MRN: [id number] Date of Birth: 06/26/1976  Transition of [facility]) CM/SW Contact:  Zenon Mayo, RN Phone Number: 03/07/2023, 11:04 AM   Clinical Narrative:     Patient is for dc today, SA resources added to AVS.  [facility] clinic added to AVS.         Patient Goals and CMS Choice      Discharge Placement                         Discharge Plan and Services Additional resources added to the After Visit Summary for                                       Social Determinants of Health (SDOH) Interventions SDOH Screenings   Tobacco Use: Unknown (03/05/2023)     Readmission Risk Interventions     No data to display

## 2023-04-12 ENCOUNTER — Encounter: Payer: Medicaid Other | Admitting: Student

## 2023-06-07 ENCOUNTER — Emergency Department (HOSPITAL_COMMUNITY)
Admission: EM | Admit: 2023-06-07 | Discharge: 2023-06-07 | Discharge status: Against Medical Advice | Payer: Medicaid Other

## 2023-06-07 NOTE — ED Notes (Signed)
Pt left. 

## 2024-08-03 ENCOUNTER — Other Ambulatory Visit (HOSPITAL_COMMUNITY): Payer: Self-pay

## 2024-08-03 DIAGNOSIS — Z8742 Personal history of other diseases of the female genital tract: Secondary | ICD-10-CM

## 2024-08-28 ENCOUNTER — Ambulatory Visit (HOSPITAL_COMMUNITY)
Admission: RE | Admit: 2024-08-28 | Discharge: 2024-08-28 | Disposition: A | Discharge status: Home / Self Care | Payer: MEDICAID | Source: Ambulatory Visit

## 2024-08-28 DIAGNOSIS — Z8742 Personal history of other diseases of the female genital tract: Secondary | ICD-10-CM | POA: Diagnosis present

## 2024-10-26 ENCOUNTER — Encounter: Admitting: Physician Assistant

## 2024-11-10 ENCOUNTER — Encounter: Admitting: Obstetrics

## 2024-12-19 ENCOUNTER — Other Ambulatory Visit (HOSPITAL_COMMUNITY): Payer: Self-pay
